# Patient Record
Sex: Male | Born: 1965 | Race: White | Hispanic: No | Marital: Single
Health system: Midwestern US, Academic
[De-identification: ages and names within clinical notes are randomized; demographics above are authoritative.]

---

## 2017-03-16 MED ORDER — DIAZEPAM 5 MG PO TAB
ORAL_TABLET | Freq: Two times a day (BID) | ORAL | 5 refills | 7.00000 days | Status: DC
Start: 2017-03-16 — End: 2017-09-13

## 2017-05-15 ENCOUNTER — Ambulatory Visit: Admit: 2017-05-15 | Discharge: 2017-05-16 | Payer: MEDICARE

## 2017-05-15 ENCOUNTER — Encounter: Admit: 2017-05-15 | Discharge: 2017-05-15 | Payer: MEDICARE

## 2017-05-15 DIAGNOSIS — Z960 Presence of urogenital implants: ICD-10-CM

## 2017-05-15 DIAGNOSIS — Z978 Presence of other specified devices: ICD-10-CM

## 2017-05-15 DIAGNOSIS — G8250 Quadriplegia, unspecified: ICD-10-CM

## 2017-05-15 DIAGNOSIS — Z720 Tobacco use: ICD-10-CM

## 2017-05-15 DIAGNOSIS — N319 Neuromuscular dysfunction of bladder, unspecified: ICD-10-CM

## 2017-05-15 DIAGNOSIS — M792 Neuralgia and neuritis, unspecified: ICD-10-CM

## 2017-05-15 DIAGNOSIS — L8990 Pressure ulcer of unspecified site, unspecified stage: ICD-10-CM

## 2017-05-15 DIAGNOSIS — R569 Unspecified convulsions: ICD-10-CM

## 2017-05-15 DIAGNOSIS — N390 Urinary tract infection, site not specified: ICD-10-CM

## 2017-05-15 DIAGNOSIS — Z9359 Other cystostomy status: ICD-10-CM

## 2017-05-15 DIAGNOSIS — S14105A Unspecified injury at C5 level of cervical spinal cord, initial encounter: ICD-10-CM

## 2017-05-16 DIAGNOSIS — L89320 Pressure ulcer of left buttock, unstageable: Secondary | ICD-10-CM

## 2017-05-16 DIAGNOSIS — R569 Unspecified convulsions: Principal | ICD-10-CM

## 2017-05-16 DIAGNOSIS — G8250 Quadriplegia, unspecified: ICD-10-CM

## 2017-08-06 ENCOUNTER — Encounter: Admit: 2017-08-06 | Discharge: 2017-08-06 | Payer: MEDICARE

## 2017-08-07 MED ORDER — LEVETIRACETAM 500 MG PO TAB
500 mg | ORAL_TABLET | Freq: Two times a day (BID) | ORAL | 5 refills | 90.00000 days | Status: AC
Start: 2017-08-07 — End: 2018-02-04

## 2017-09-13 ENCOUNTER — Encounter: Admit: 2017-09-13 | Discharge: 2017-09-13 | Payer: MEDICARE

## 2017-09-13 MED ORDER — DIAZEPAM 5 MG PO TAB
ORAL_TABLET | Freq: Two times a day (BID) | ORAL | 5 refills | 7.00000 days | Status: AC
Start: 2017-09-13 — End: 2018-03-12

## 2017-09-13 MED ORDER — DIAZEPAM 5 MG PO TAB
ORAL_TABLET | Freq: Two times a day (BID) | 5 refills | Status: CN
Start: 2017-09-13 — End: ?

## 2017-10-01 ENCOUNTER — Encounter: Admit: 2017-10-01 | Discharge: 2017-10-01 | Payer: MEDICARE

## 2017-10-01 MED ORDER — BACLOFEN 20 MG PO TAB
ORAL_TABLET | Freq: Three times a day (TID) | 5 refills | Status: AC
Start: 2017-10-01 — End: 2018-04-09

## 2017-10-01 MED ORDER — NIFEDIPINE 10 MG PO CAP
ORAL_CAPSULE | 0 refills | Status: SS | PRN
Start: 2017-10-01 — End: 2019-04-25

## 2017-10-01 MED ORDER — NIFEDIPINE 10 MG PO CAP
10 mg | Freq: Three times a day (TID) | ORAL | 0 refills | Status: CN
Start: 2017-10-01 — End: ?

## 2017-10-01 NOTE — Telephone Encounter
Baclofen 20mg = 40mg  twice a day is now increased to Baclofen 20mg  take two by mouth three times a day=40mg  Three times a day.  Dr.Hairston will call Eric Cabrera about medication procardia he is interested in and what she may recommend for him, because he has concerns about autonomic dysreflexia, but at this time his blood pressure is good.

## 2017-10-01 NOTE — Telephone Encounter
Dr.Hairston, Mr Bednarczyk called stated he will be going in to the hospital to have surgery this Wednesday because of a new decubitus ulcer, but what he is wanting is an increase of the Baclofen medication from 40mg  twice a day to 60mg  twice a day stating with not moving around sitting up in the chair because of the ulcers he is having increase in the spasms, then he mention when he lived in [place] when his spasm increased the physician would put him on Procardia to help with blood pressure if it increased to keep from having a stroke. I stated he may need to speak with his PCP about the Procardia. And that  I would call you because he is having surgery wed and I did not know if you want to increase the Baclofen right now?

## 2017-10-02 ENCOUNTER — Encounter: Admit: 2017-10-02 | Discharge: 2017-10-02 | Payer: MEDICARE

## 2017-10-02 NOTE — Telephone Encounter
Clarified the dosing of the medication Procardia 10mg , it is daily -prn spoke with [facility] pharmacist.

## 2017-12-20 ENCOUNTER — Encounter: Admit: 2017-12-20 | Discharge: 2017-12-20 | Payer: MEDICARE

## 2018-02-02 ENCOUNTER — Encounter: Admit: 2018-02-02 | Discharge: 2018-02-02 | Payer: MEDICARE

## 2018-02-04 MED ORDER — LEVETIRACETAM 500 MG PO TAB
500 mg | ORAL_TABLET | Freq: Two times a day (BID) | ORAL | 2 refills | 90.00000 days | Status: AC
Start: 2018-02-04 — End: 2018-09-16

## 2018-03-12 ENCOUNTER — Encounter: Admit: 2018-03-12 | Discharge: 2018-03-12 | Payer: MEDICARE

## 2018-03-12 MED ORDER — DIAZEPAM 5 MG PO TAB
ORAL_TABLET | Freq: Two times a day (BID) | ORAL | 5 refills | 7.00000 days | Status: AC
Start: 2018-03-12 — End: 2018-05-28

## 2018-04-08 ENCOUNTER — Encounter: Admit: 2018-04-08 | Discharge: 2018-04-08 | Payer: MEDICARE

## 2018-04-09 MED ORDER — BACLOFEN 20 MG PO TAB
ORAL_TABLET | Freq: Three times a day (TID) | 1 refills | Status: AC
Start: 2018-04-09 — End: 2018-05-28

## 2018-05-28 ENCOUNTER — Ambulatory Visit: Admit: 2018-05-28 | Discharge: 2018-05-29 | Payer: MEDICARE

## 2018-05-28 ENCOUNTER — Encounter: Admit: 2018-05-28 | Discharge: 2018-05-28 | Payer: MEDICARE

## 2018-05-28 DIAGNOSIS — Z9359 Other cystostomy status: ICD-10-CM

## 2018-05-28 DIAGNOSIS — G8250 Quadriplegia, unspecified: Principal | ICD-10-CM

## 2018-05-28 DIAGNOSIS — S14105D Unspecified injury at C5 level of cervical spinal cord, subsequent encounter: Principal | ICD-10-CM

## 2018-05-28 DIAGNOSIS — S14105A Unspecified injury at C5 level of cervical spinal cord, initial encounter: ICD-10-CM

## 2018-05-28 DIAGNOSIS — L8990 Pressure ulcer of unspecified site, unspecified stage: ICD-10-CM

## 2018-05-28 DIAGNOSIS — M792 Neuralgia and neuritis, unspecified: ICD-10-CM

## 2018-05-28 DIAGNOSIS — Z978 Presence of other specified devices: ICD-10-CM

## 2018-05-28 DIAGNOSIS — Z960 Presence of urogenital implants: ICD-10-CM

## 2018-05-28 DIAGNOSIS — Z720 Tobacco use: ICD-10-CM

## 2018-05-28 DIAGNOSIS — N319 Neuromuscular dysfunction of bladder, unspecified: ICD-10-CM

## 2018-05-28 DIAGNOSIS — N390 Urinary tract infection, site not specified: ICD-10-CM

## 2018-05-28 DIAGNOSIS — R569 Unspecified convulsions: ICD-10-CM

## 2018-05-28 MED ORDER — DIAZEPAM 5 MG PO TAB
ORAL_TABLET | Freq: Two times a day (BID) | ORAL | 5 refills | 7.00000 days | Status: AC
Start: 2018-05-28 — End: 2018-10-15

## 2018-05-28 MED ORDER — BACLOFEN 20 MG PO TAB
ORAL_TABLET | Freq: Three times a day (TID) | 1 refills | Status: AC
Start: 2018-05-28 — End: 2018-10-21

## 2018-05-29 DIAGNOSIS — R569 Unspecified convulsions: ICD-10-CM

## 2018-05-29 DIAGNOSIS — Z720 Tobacco use: ICD-10-CM

## 2018-05-29 DIAGNOSIS — G904 Autonomic dysreflexia: ICD-10-CM

## 2018-05-29 DIAGNOSIS — N319 Neuromuscular dysfunction of bladder, unspecified: ICD-10-CM

## 2018-05-29 DIAGNOSIS — G8250 Quadriplegia, unspecified: Secondary | ICD-10-CM

## 2018-05-29 DIAGNOSIS — L89150 Pressure ulcer of sacral region, unstageable: Secondary | ICD-10-CM

## 2018-09-15 ENCOUNTER — Encounter: Admit: 2018-09-15 | Discharge: 2018-09-15 | Payer: MEDICARE

## 2018-09-16 ENCOUNTER — Encounter: Admit: 2018-09-16 | Discharge: 2018-09-16 | Payer: MEDICARE

## 2018-09-16 MED ORDER — DIAZEPAM 5 MG PO TAB
ORAL_TABLET | Freq: Two times a day (BID) | 5 refills
Start: 2018-09-16 — End: ?

## 2018-09-16 MED ORDER — LEVETIRACETAM 500 MG PO TAB
500 mg | ORAL_TABLET | Freq: Two times a day (BID) | ORAL | 2 refills | 90.00000 days | Status: AC
Start: 2018-09-16 — End: 2018-10-21

## 2018-09-23 ENCOUNTER — Encounter: Admit: 2018-09-23 | Discharge: 2018-09-23 | Payer: MEDICARE

## 2018-10-02 ENCOUNTER — Encounter: Admit: 2018-10-02 | Discharge: 2018-10-02 | Payer: MEDICARE

## 2018-10-15 MED ORDER — DIAZEPAM 5 MG PO TAB
ORAL_TABLET | Freq: Two times a day (BID) | 5 refills | Status: SS
Start: 2018-10-15 — End: 2019-04-01

## 2018-10-18 ENCOUNTER — Encounter: Admit: 2018-10-18 | Discharge: 2018-10-18 | Payer: MEDICARE

## 2018-10-21 ENCOUNTER — Encounter: Admit: 2018-10-21 | Discharge: 2018-10-21 | Payer: MEDICARE

## 2018-10-21 MED ORDER — LEVETIRACETAM 500 MG PO TAB
500 mg | ORAL_TABLET | Freq: Two times a day (BID) | ORAL | 2 refills | Status: SS
Start: 2018-10-21 — End: 2019-04-01

## 2018-10-21 MED ORDER — BACLOFEN 20 MG PO TAB
ORAL_TABLET | Freq: Three times a day (TID) | 1 refills | Status: SS
Start: 2018-10-21 — End: 2019-04-01

## 2018-10-25 ENCOUNTER — Encounter: Admit: 2018-10-25 | Discharge: 2018-10-25 | Payer: MEDICARE

## 2019-01-24 ENCOUNTER — Encounter: Admit: 2019-01-24 | Discharge: 2019-01-24 | Payer: MEDICARE

## 2019-03-22 ENCOUNTER — Encounter: Admit: 2019-03-22 | Discharge: 2019-03-22 | Payer: MEDICARE

## 2019-03-22 ENCOUNTER — Encounter: Admit: 2019-03-22 | Discharge: 2019-03-23 | Payer: MEDICARE

## 2019-03-23 ENCOUNTER — Encounter: Admit: 2019-03-23 | Discharge: 2019-03-23 | Payer: MEDICARE

## 2019-03-23 ENCOUNTER — Inpatient Hospital Stay
Admit: 2019-03-23 | Discharge: 2019-04-02 | Disposition: A | Discharge status: Skilled Nursing Facility | Payer: MEDICARE | Source: Other Acute Inpatient Hospital

## 2019-03-23 DIAGNOSIS — G4089 Other seizures: Secondary | ICD-10-CM

## 2019-03-23 DIAGNOSIS — L89320 Pressure ulcer of left buttock, unstageable: ICD-10-CM

## 2019-03-23 LAB — COMPREHENSIVE METABOLIC PANEL
Lab: 11 10*3/uL (ref 3–12)
Lab: 135 MMOL/L — ABNORMAL LOW (ref 137–147)
Lab: 14 U/L (ref 7–40)
Lab: 4.3 MMOL/L (ref 3.5–5.1)
Lab: 7 U/L (ref 7–56)
Lab: 8.7 mg/dL (ref 8.5–10.6)
Lab: >60 mL/min (ref >60)

## 2019-03-23 LAB — URINALYSIS MICROSCOPIC REFLEX TO CULTURE

## 2019-03-23 LAB — URINALYSIS DIPSTICK REFLEX TO CULTURE
Lab: NEGATIVE
Lab: POSITIVE — AB

## 2019-03-23 LAB — C REACTIVE PROTEIN (CRP): Lab: 20 mg/dL — ABNORMAL HIGH (ref <1.0)

## 2019-03-23 LAB — PROTIME INR (PT): Lab: 1.4 g/dL — ABNORMAL HIGH (ref 0.8–1.2)

## 2019-03-23 LAB — CBC AND DIFF
Lab: 13 10*3/uL — ABNORMAL HIGH (ref 4.5–11.0)
Lab: 5.1 M/UL (ref 4.4–5.5)

## 2019-03-23 LAB — MAGNESIUM: Lab: 2 mg/dL (ref 1.6–2.6)

## 2019-03-23 MED ORDER — CLINDAMYCIN IN 5 % DEXTROSE 900 MG/50 ML IV PGBK
900 mg | INTRAVENOUS | 0 refills | Status: DC
Start: 2019-03-23 — End: 2019-03-24
  Administered 2019-03-23 – 2019-03-24 (×5): 900 mg via INTRAVENOUS

## 2019-03-23 MED ORDER — DIAZEPAM 5 MG PO TAB
5 mg | Freq: Two times a day (BID) | ORAL | 0 refills | Status: DC
Start: 2019-03-23 — End: 2019-03-24
  Administered 2019-03-23 – 2019-03-24 (×3): 5 mg via ORAL

## 2019-03-23 MED ORDER — ENOXAPARIN 40 MG/0.4 ML SC SYRG
40 mg | Freq: Every day | SUBCUTANEOUS | 0 refills | Status: DC
Start: 2019-03-23 — End: 2019-03-25
  Administered 2019-03-23: 09:00:00 40 mg via SUBCUTANEOUS

## 2019-03-23 MED ORDER — BACLOFEN 20 MG PO TAB
40 mg | Freq: Three times a day (TID) | ORAL | 0 refills | Status: DC
Start: 2019-03-23 — End: 2019-03-31
  Administered 2019-03-23 – 2019-03-31 (×24): 40 mg via ORAL

## 2019-03-23 MED ORDER — IMS MIXTURE TEMPLATE
750 mg | Freq: Two times a day (BID) | ORAL | 0 refills | Status: DC
Start: 2019-03-23 — End: 2019-04-02
  Administered 2019-03-24 – 2019-04-01 (×34): 750 mg via ORAL

## 2019-03-23 MED ORDER — LEVETIRACETAM 500 MG PO TAB
500 mg | Freq: Two times a day (BID) | ORAL | 0 refills | Status: DC
Start: 2019-03-23 — End: 2019-03-23
  Administered 2019-03-23: 16:00:00 500 mg via ORAL

## 2019-03-23 MED ORDER — SENNOSIDES-DOCUSATE SODIUM 8.6-50 MG PO TAB
2 | Freq: Every day | ORAL | 0 refills | Status: DC
Start: 2019-03-23 — End: 2019-03-27
  Administered 2019-03-24 – 2019-03-25 (×2): 2 via ORAL

## 2019-03-23 MED ORDER — DOCUSATE SODIUM 100 MG PO CAP
100 mg | Freq: Every day | ORAL | 0 refills | Status: DC
Start: 2019-03-23 — End: 2019-03-23
  Administered 2019-03-23: 14:00:00 100 mg via ORAL

## 2019-03-23 MED ORDER — LACTATED RINGERS IV SOLP
1000 mL | Freq: Once | INTRAVENOUS | 0 refills | Status: CP
Start: 2019-03-23 — End: ?
  Administered 2019-03-23: 16:00:00 1000 mL via INTRAVENOUS

## 2019-03-23 MED ORDER — LACTATED RINGERS IV SOLP
500 mL | INTRAVENOUS | 0 refills | Status: DC
Start: 2019-03-23 — End: 2019-03-23

## 2019-03-23 MED ORDER — GADOBENATE DIMEGLUMINE 529 MG/ML (0.1MMOL/0.2ML) IV SOLN
15 mL | Freq: Once | INTRAVENOUS | 0 refills | Status: CP
Start: 2019-03-23 — End: ?
  Administered 2019-03-23: 15:00:00 15 mL via INTRAVENOUS

## 2019-03-23 MED ADMIN — SODIUM CHLORIDE 0.9 % IV SOLP [27838]: 1000 mL | INTRAVENOUS | @ 10:00:00 | Stop: 2019-03-23 | NDC 00338004904
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
## 2019-03-24 ENCOUNTER — Encounter: Admit: 2019-03-24 | Discharge: 2019-03-24 | Payer: MEDICARE

## 2019-03-24 ENCOUNTER — Inpatient Hospital Stay: Admit: 2019-03-24 | Discharge: 2019-03-24 | Payer: MEDICARE

## 2019-03-24 DIAGNOSIS — Z960 Presence of urogenital implants: ICD-10-CM

## 2019-03-24 DIAGNOSIS — M792 Neuralgia and neuritis, unspecified: ICD-10-CM

## 2019-03-24 DIAGNOSIS — N390 Urinary tract infection, site not specified: ICD-10-CM

## 2019-03-24 DIAGNOSIS — N319 Neuromuscular dysfunction of bladder, unspecified: ICD-10-CM

## 2019-03-24 DIAGNOSIS — S14105A Unspecified injury at C5 level of cervical spinal cord, initial encounter: ICD-10-CM

## 2019-03-24 DIAGNOSIS — G8250 Quadriplegia, unspecified: Secondary | ICD-10-CM

## 2019-03-24 DIAGNOSIS — R569 Unspecified convulsions: ICD-10-CM

## 2019-03-24 DIAGNOSIS — L8990 Pressure ulcer of unspecified site, unspecified stage: ICD-10-CM

## 2019-03-24 DIAGNOSIS — Z9359 Other cystostomy status: ICD-10-CM

## 2019-03-24 DIAGNOSIS — G4089 Other seizures: Secondary | ICD-10-CM

## 2019-03-24 DIAGNOSIS — Z720 Tobacco use: ICD-10-CM

## 2019-03-24 DIAGNOSIS — Z978 Presence of other specified devices: ICD-10-CM

## 2019-03-24 LAB — GRAM STAIN

## 2019-03-24 MED ORDER — LEVOFLOXACIN 750 MG PO TAB
750 mg | ORAL | 0 refills | Status: DC
Start: 2019-03-24 — End: 2019-03-24

## 2019-03-24 MED ORDER — VANCOMYCIN 1,250 MG IVPB
15 mg/kg | Freq: Two times a day (BID) | INTRAVENOUS | 0 refills | Status: DC
Start: 2019-03-24 — End: 2019-03-24

## 2019-03-24 MED ORDER — VANCOMYCIN 1G/250ML D5W IVPB (VIAL2BAG)
1000 mg | Freq: Two times a day (BID) | INTRAVENOUS | 0 refills | Status: DC
Start: 2019-03-24 — End: 2019-03-26
  Administered 2019-03-24 – 2019-03-26 (×8): 1000 mg via INTRAVENOUS

## 2019-03-24 MED ORDER — DIAZEPAM 5 MG PO TAB
2.5 mg | Freq: Two times a day (BID) | ORAL | 0 refills | Status: DC
Start: 2019-03-24 — End: 2019-04-02
  Administered 2019-03-25 – 2019-04-02 (×17): 2.5 mg via ORAL

## 2019-03-24 MED ORDER — PIPERACILLIN/TAZOBACTAM 4.5 G/NS IVPB (MB+)
4.5 g | INTRAVENOUS | 0 refills | Status: DC
Start: 2019-03-24 — End: 2019-03-28
  Administered 2019-03-24 – 2019-03-28 (×32): 4.5 g via INTRAVENOUS

## 2019-03-24 MED ORDER — VANCOMYCIN PHARMACY TO MANAGE
1 | 0 refills | Status: DC
Start: 2019-03-24 — End: 2019-03-26

## 2019-03-24 MED ORDER — VANCOMYCIN 1,500 MG IVPB
20 mg/kg | Freq: Once | INTRAVENOUS | 0 refills | Status: DC
Start: 2019-03-24 — End: 2019-03-24

## 2019-03-24 MED ADMIN — LACTATED RINGERS IV SOLP [4318]: 1000 mL | INTRAVENOUS | @ 02:00:00 | Stop: 2019-03-24 | NDC 00338011704

## 2019-03-24 NOTE — Progress Notes
03/23/19 2053   Vitals   BP (!) 79/46   Mean NBP (Calculated) 57 MM HG     Med-2 paged of above.

## 2019-03-24 NOTE — Progress Notes
03/23/19 1716   Vitals   BP (!) 82/43   Mean NBP (Calculated) 56 MM HG     Dr. Alona Bene notified. Pt asymptomatic, no new orders at this time. Will continue to monitor.

## 2019-03-25 ENCOUNTER — Inpatient Hospital Stay: Admit: 2019-03-25 | Discharge: 2019-03-25 | Payer: MEDICARE

## 2019-03-25 DIAGNOSIS — G4089 Other seizures: ICD-10-CM

## 2019-03-25 MED ORDER — VITS A AND D-WHITE PET-LANOLIN TP OINT
TOPICAL | 0 refills | Status: DC | PRN
Start: 2019-03-25 — End: 2019-04-02
  Administered 2019-03-25: 22:00:00 via TOPICAL

## 2019-03-25 MED ORDER — BISACODYL 10 MG RE SUPP
10 mg | Freq: Once | RECTAL | 0 refills | Status: CP
Start: 2019-03-25 — End: ?
  Administered 2019-03-26: 04:00:00 10 mg via RECTAL

## 2019-03-25 MED ADMIN — SODIUM CHLORIDE 0.9 % IV SOLP [27838]: 250 mL | INTRAVENOUS | @ 03:00:00 | Stop: 2019-03-25 | NDC 00338004902

## 2019-03-25 NOTE — Progress Notes
2149  Text sent to Med 2 [phone] to request order for [facility].  Pts extremities difficult to accurately monitor.  RT to place ear probe.

## 2019-03-25 NOTE — Progress Notes
Eric Cabrera is a 53 y.o. male.  Patient reports he is concerned with getting medicare to approve a certain mattress to help with his bedsore when he goes home after this hospitalization, but reports no acute events overnight. Denies any fevers, N/V or chills or seizure-like symptoms         Medications  Scheduled Meds:baclofen (LIORESAL) tablet 40 mg, 40 mg, Oral, TID  diazePAM (VALIUM) tablet 2.5 mg, 2.5 mg, Oral, BID  levETIRAcetam (KEPPRA) tablet 750 mg, 750 mg, Oral, BID  piperacillin/tazobactam (ZOSYN) 4.5 g in sodium chloride 0.9% (NS) 100 mL IVPB (MB+), 4.5 g, Intravenous, Q6H*  senna/docusate (SENOKOT-S) tablet 2 tablet, 2 tablet, Oral, QDAY  vancomycin (VANCOCIN) 1,000 mg in dextrose 5% (D5W) 250 mL IVPB (Vial2Bag), 1,000 mg, Intravenous, Q12H*    Continuous Infusions:  PRN and Respiratory Meds:vancomycin, pharmacy to manage Per Pharmacy, vitamin A & D PRN      Objective                       Vital Signs: Last Filed                 Vital Signs: 24 Hour Range   BP: 94/60 (04/21 1316)  Temp: 36.8 ???C (98.3 ???F) (04/21 0830)  Pulse: 79 (04/21 1316)  Respirations: 18 PER MINUTE (04/21 1316)  SpO2: 97 % (04/21 1316) BP: (73-113)/(39-67)   Temp:  [35.8 ???C (96.5 ???F)-36.8 ???C (98.3 ???F)]   Pulse:  [61-105]   Respirations:  [14 PER MINUTE-18 PER MINUTE]   SpO2:  [94 %-97 %]      Vitals:    03/23/19 0200   Weight: 76.8 kg (169 lb 5 oz)           Physical Exam     53 y.o. male     General: alert, in no acute distress, quadriplegic  Eyes: No scleral icterus  Ears: Normal appearing external ears  Throat: No pharyngeal erythema identified, no lymphadenopathy  CV: HR RRR, no murmur, no rub/gallop  Pulm: Lungs CTA bilaterally, no wheezes/rhonci/rales auscultated  Abdomen: hypoactive bowel sound, soft, non-tender, distended abdomen with air  Extremities: No lower extremity edema idenitifed. Small areas of skin breakdown. Near coccyx patient has too fistulized tracts that look clean and intact. He has redness surrounding right greater trochanter with fluctuance that feels like subcutaneous edema.  Integumentary: No obvious rash or skin breakdown  Neuro: CN II-XII intact, oriented x 4  Psych: Normal mood and affect    Lab Review  CBC w/Diff    Lab Results   Component Value Date/Time    WBC 5.6 03/25/2019 04:26 AM    RBC 4.52 03/25/2019 04:26 AM    HGB 13.4 (L) 03/25/2019 04:26 AM    HCT 40.9 03/25/2019 04:26 AM    MCV 90.5 03/25/2019 04:26 AM    MCH 29.6 03/25/2019 04:26 AM    MCHC 32.7 03/25/2019 04:26 AM    RDW 15.0 03/25/2019 04:26 AM    PLTCT 342 03/25/2019 04:26 AM    MPV 7.4 03/25/2019 04:26 AM    Lab Results   Component Value Date/Time    NEUT 70 03/25/2019 04:26 AM    ANC 3.90 03/25/2019 04:26 AM    LYMA 19 (L) 03/25/2019 04:26 AM    ALC 1.00 03/25/2019 04:26 AM    MONA 7 03/25/2019 04:26 AM    AMC 0.40 03/25/2019 04:26 AM    EOSA 3 03/25/2019 04:26 AM    AEC 0.20  03/25/2019 04:26 AM    BASA 1 03/25/2019 04:26 AM    ABC 0.00 03/25/2019 04:26 AM        Comprehensive Metabolic Profile    Lab Results   Component Value Date/Time    NA 143 03/25/2019 04:26 AM    K 4.5 03/25/2019 04:26 AM    CL 105 03/25/2019 04:26 AM    CO2 30 03/25/2019 04:26 AM    GAP 8 03/25/2019 04:26 AM    BUN 6 (L) 03/25/2019 04:26 AM    CR 0.24 (L) 03/25/2019 04:26 AM    GLU 90 03/25/2019 04:26 AM    GLU 67 08/17/2016    Lab Results   Component Value Date/Time    CA 8.7 03/25/2019 04:26 AM    PO4 3.3 03/23/2019 04:16 AM    ALBUMIN 3.0 (L) 03/25/2019 04:26 AM    TOTPROT 6.9 03/25/2019 04:26 AM    ALKPHOS 50 03/25/2019 04:26 AM    AST 11 03/25/2019 04:26 AM    ALT 9 03/25/2019 04:26 AM    TOTBILI 0.2 (L) 03/25/2019 04:26 AM    GFR >60 03/25/2019 04:26 AM    GFRAA >60 03/25/2019 04:26 AM          Other pertinent lab and radiology reviewed and/or noted in Assessment and Plan.      Patient seen and discussed with Dr. Dorien Chihuahua MD  PGY1 Internal Medicine   [facility] [place]   [phone]

## 2019-03-25 NOTE — Anesthesia Pre-Procedure Evaluation
Airway Findings      Mallampati: II      TM distance: >3 FB      Neck ROM: full      Mouth opening: good      Airway patency: adequate    Dental Findings: Negative      Cardiovascular Findings: Negative      Rhythm: regular      Rate: normal    Pulmonary Findings:    Decreased breath sounds.    Abdominal Findings: Negative      Neurological Findings: Negative         Diagnostic Tests  Hematology:   Lab Results   Component Value Date    HGB 13.4 03/25/2019    HCT 40.9 03/25/2019    PLTCT 342 03/25/2019    WBC 5.6 03/25/2019    NEUT 70 03/25/2019    ANC 3.90 03/25/2019    ALC 1.00 03/25/2019    MONA 7 03/25/2019    AMC 0.40 03/25/2019    EOSA 3 03/25/2019    ABC 0.00 03/25/2019    MCV 90.5 03/25/2019    MCH 29.6 03/25/2019    MCHC 32.7 03/25/2019    MPV 7.4 03/25/2019    RDW 15.0 03/25/2019         General Chemistry:   Lab Results   Component Value Date    NA 143 03/25/2019    K 4.5 03/25/2019    CL 105 03/25/2019    CO2 30 03/25/2019    GAP 8 03/25/2019    BUN 6 03/25/2019    CR 0.24 03/25/2019    GLU 90 03/25/2019    GLU 67 08/17/2016    CA 8.7 03/25/2019    ALBUMIN 3.0 03/25/2019    LACTIC 0.8 08/14/2014    OBSCA 1.12 08/19/2014    MG 2.0 03/23/2019    TOTBILI 0.2 03/25/2019    PO4 3.3 03/23/2019      Coagulation:   Lab Results   Component Value Date    PTT 31.0 09/27/2016    INR 1.4 03/23/2019         Anesthesia Plan    ASA score: 3   Plan: MAC and general  Induction method: intravenous  NPO status: acceptable      Informed Consent  Anesthetic plan and risks discussed with patient.

## 2019-03-26 ENCOUNTER — Encounter: Admit: 2019-03-26 | Discharge: 2019-03-26 | Payer: MEDICARE

## 2019-03-26 DIAGNOSIS — Z960 Presence of urogenital implants: ICD-10-CM

## 2019-03-26 DIAGNOSIS — Z9359 Other cystostomy status: ICD-10-CM

## 2019-03-26 DIAGNOSIS — N319 Neuromuscular dysfunction of bladder, unspecified: ICD-10-CM

## 2019-03-26 DIAGNOSIS — R569 Unspecified convulsions: ICD-10-CM

## 2019-03-26 DIAGNOSIS — Z720 Tobacco use: ICD-10-CM

## 2019-03-26 DIAGNOSIS — M792 Neuralgia and neuritis, unspecified: ICD-10-CM

## 2019-03-26 DIAGNOSIS — L8990 Pressure ulcer of unspecified site, unspecified stage: ICD-10-CM

## 2019-03-26 DIAGNOSIS — G4089 Other seizures: ICD-10-CM

## 2019-03-26 DIAGNOSIS — N390 Urinary tract infection, site not specified: ICD-10-CM

## 2019-03-26 DIAGNOSIS — Z978 Presence of other specified devices: ICD-10-CM

## 2019-03-26 DIAGNOSIS — G8250 Quadriplegia, unspecified: ICD-10-CM

## 2019-03-26 DIAGNOSIS — S14105A Unspecified injury at C5 level of cervical spinal cord, initial encounter: ICD-10-CM

## 2019-03-26 LAB — CULTURE-WOUND/TISSUE/FLUID(AEROBIC ONLY)W/SENSITIVITY

## 2019-03-26 LAB — CULTURE-URINE W/SENSITIVITY
Lab: <10
Lab: >10
Lab: >10 — AB

## 2019-03-26 LAB — VANCOMYCIN TROUGH: Lab: 11 ug/mL (ref 10.0–20.0)

## 2019-03-26 LAB — PROTIME INR (PT): Lab: 1.7 — ABNORMAL HIGH (ref 0.8–1.2)

## 2019-03-26 MED ORDER — MIDAZOLAM 1 MG/ML IJ SOLN
INTRAVENOUS | 0 refills | Status: DC
Start: 2019-03-26 — End: 2019-03-26
  Administered 2019-03-26: 19:00:00 2 mg via INTRAVENOUS

## 2019-03-26 MED ORDER — DOCUSATE SODIUM 100 MG PO CAP
100 mg | Freq: Two times a day (BID) | ORAL | 0 refills | Status: DC
Start: 2019-03-26 — End: 2019-04-02
  Administered 2019-03-27 – 2019-04-02 (×13): 100 mg via ORAL

## 2019-03-26 MED ORDER — CEFAZOLIN 1 GRAM IJ SOLR
0 refills | Status: DC
Start: 2019-03-26 — End: 2019-03-26
  Administered 2019-03-26: 20:00:00 2 g via INTRAVENOUS

## 2019-03-26 MED ORDER — ZINC SULFATE 220 (50) MG PO CAP
220 mg | Freq: Every day | ORAL | 0 refills | Status: DC
Start: 2019-03-26 — End: 2019-04-02
  Administered 2019-03-27 – 2019-04-01 (×6): 220 mg via ORAL

## 2019-03-26 MED ORDER — LACTATED RINGERS IV SOLP
1000 mL | INTRAVENOUS | 0 refills | Status: DC
Start: 2019-03-26 — End: 2019-03-27

## 2019-03-26 MED ORDER — ASCORBIC ACID (VITAMIN C) 500 MG PO TAB
500 mg | Freq: Every day | ORAL | 0 refills | Status: DC
Start: 2019-03-26 — End: 2019-04-02
  Administered 2019-03-27 – 2019-04-01 (×6): 500 mg via ORAL

## 2019-03-26 MED ORDER — PROPOFOL 10 MG/ML IV EMUL 50 ML (INFUSION)(AM)(OR)
INTRAVENOUS | 0 refills | Status: DC
Start: 2019-03-26 — End: 2019-03-26
  Administered 2019-03-26: 20:00:00 100 ug/kg/min via INTRAVENOUS

## 2019-03-26 MED ORDER — SENNOSIDES 8.6 MG PO TAB
1 | Freq: Two times a day (BID) | ORAL | 0 refills | Status: DC
Start: 2019-03-26 — End: 2019-04-02
  Administered 2019-03-29 (×2): 1 via ORAL

## 2019-03-26 MED ADMIN — LACTATED RINGERS IV SOLP [4318]: 1000 mL | INTRAVENOUS | @ 18:00:00 | Stop: 2019-03-26 | NDC 00338011704

## 2019-03-26 NOTE — Progress Notes
Med 2 Progress Note    Name:  Eric Cabrera   Today's Date:  03/26/2019  Admission Date: 03/23/2019  LOS: 3 days                     Principal Problem:    Subacute osteomyelitis of right femur (HCC)  Active Problems:    Quadriplegic spinal paralysis (HCC)    Complicated UTI (urinary tract infection)    Neurogenic bladder    Suprapubic catheter (HCC)    Leukocytosis    Decubitus ulcer of sacral region, unstageable (HCC)    Nonintractable epilepsy (HCC)         Assessment and Plan:  Eric Cabrera is a pleasant 53 year old male with history of quadriplegia, Epilepsy,???Multiple???Pressure Ulcers,???neurogenic bladder (w/ suprapubic cath) and tobacco use who presents as a transfer from [facility] for evaluation of witnessed 'seizure-like activity' by a caregiver.  ???  Seizure:  - Patient with breakthrough seizure this afternoon 4/18. Description of recent seizure was in-line with description of seizure in Neurology note.  - Patient diagnosed with seizure initially in 2015.  - Follows with  neurology, Dr. Jenelle Mages. Last seen 05/2018.  - CT head @ OSH without evidence of bleed or mass effect.  - Keppra 500mg  BID for seizure. S/p 1500 mg Keppra load at the OSH.  - Uncertain cause currently of suspected seizure. He has been evaluated before for breakthrough seizure and decided not to pursue further MRI/EEG. Lowered threshold may be related to patient infection.  - EEG 2015 IMPRESSION: This 30 minutes long EEG recording of this 53 year old confused man is abnormal due to diffuse background slowing suggesting a mild-to-moderate bihemispheric dysfunction of nonspecific etiologies. ???The triphasic waves noted are also seen in patients with nonspecific bihemispheric dysfunction. ???The rarely seen diffusely distributed, but centrally predominant sharp wave discharges probably reflect the poorly formed vertex sharp waves, however, epileptiform discharge could not be ruled out.  ???Plan less fine overnight; reiterated desire again for get proper mattress to sleep on that will be conducive to his quadriplegia. Denies any fever or chills; is hungry but agreeable to wait until after I&D by ortho later today         Medications  Scheduled Meds:[MAR Hold] baclofen (LIORESAL) tablet 40 mg, 40 mg, Oral, TID  [MAR Hold] diazePAM (VALIUM) tablet 2.5 mg, 2.5 mg, Oral, BID  levETIRAcetam (KEPPRA) tablet 750 mg, 750 mg, Oral, BID  piperacillin/tazobactam (ZOSYN) 4.5 g in sodium chloride 0.9% (NS) 100 mL IVPB (MB+), 4.5 g, Intravenous, Q6H*  [MAR Hold] senna/docusate (SENOKOT-S) tablet 2 tablet, 2 tablet, Oral, QDAY    Continuous Infusions:  PRN and Respiratory Meds:[MAR Hold] vitamin A & D PRN      Objective                       Vital Signs: Last Filed                 Vital Signs: 24 Hour Range   BP: 101/85 (04/22 1159)  Temp: 36.3 ???C (97.4 ???F) (04/22 1159)  Pulse: 58 (04/22 1159)  Respirations: 16 PER MINUTE (04/22 1159)  SpO2: 97 % (04/22 1159) BP: (93-154)/(60-85)   Temp:  [36.3 ???C (97.4 ???F)-36.6 ???C (97.9 ???F)]   Pulse:  [51-79]   Respirations:  [14 PER MINUTE-18 PER MINUTE]   SpO2:  [94 %-100 %]      Vitals:    03/23/19 0200   Weight: 76.8 kg (169 lb  5 oz)           Physical Exam     53 y.o. male     General: alert, in no acute distress, quadriplegic  Eyes: No scleral icterus  Ears: Normal appearing external ears  Throat: No pharyngeal erythema identified, no lymphadenopathy  CV: HR RRR, no murmur, no rub/gallop  Pulm: Lungs CTA bilaterally, no wheezes/rhonci/rales auscultated  Abdomen: hypoactive bowel sound, soft, non-tender, distended abdomen with air  Extremities: No lower extremity edema idenitifed. Small areas of skin breakdown. Near coccyx patient has too fistulized tracts that look clean and intact. He has redness surrounding right greater trochanter with fluctuance that feels like subcutaneous edema.  Integumentary: No obvious rash or skin breakdown  Neuro: CN II-XII intact, oriented x 4 Psych: Normal mood and affect    Lab Review  CBC w/Diff    Lab Results   Component Value Date/Time    WBC 6.4 03/26/2019 04:26 AM    RBC 4.41 03/26/2019 04:26 AM    HGB 12.9 (L) 03/26/2019 04:26 AM    HCT 39.6 (L) 03/26/2019 04:26 AM    MCV 89.9 03/26/2019 04:26 AM    MCH 29.4 03/26/2019 04:26 AM    MCHC 32.7 03/26/2019 04:26 AM    RDW 15.1 (H) 03/26/2019 04:26 AM    PLTCT 356 03/26/2019 04:26 AM    MPV 7.1 03/26/2019 04:26 AM    Lab Results   Component Value Date/Time    NEUT 63 03/26/2019 04:26 AM    ANC 4.10 03/26/2019 04:26 AM    LYMA 27 03/26/2019 04:26 AM    ALC 1.80 03/26/2019 04:26 AM    MONA 7 03/26/2019 04:26 AM    AMC 0.40 03/26/2019 04:26 AM    EOSA 2 03/26/2019 04:26 AM    AEC 0.10 03/26/2019 04:26 AM    BASA 1 03/26/2019 04:26 AM    ABC 0.00 03/26/2019 04:26 AM        Comprehensive Metabolic Profile    Lab Results   Component Value Date/Time    NA 143 03/26/2019 04:26 AM    K 4.3 03/26/2019 04:26 AM    CL 104 03/26/2019 04:26 AM    CO2 31 (H) 03/26/2019 04:26 AM    GAP 8 03/26/2019 04:26 AM    BUN 6 (L) 03/26/2019 04:26 AM    CR 0.23 (L) 03/26/2019 04:26 AM    GLU 77 03/26/2019 04:26 AM    GLU 67 08/17/2016    Lab Results   Component Value Date/Time    CA 8.6 03/26/2019 04:26 AM    PO4 3.3 03/23/2019 04:16 AM    ALBUMIN 2.9 (L) 03/26/2019 04:26 AM    TOTPROT 6.5 03/26/2019 04:26 AM    ALKPHOS 49 03/26/2019 04:26 AM    AST 20 03/26/2019 04:26 AM    ALT 9 03/26/2019 04:26 AM    TOTBILI 0.2 (L) 03/26/2019 04:26 AM    GFR >60 03/26/2019 04:26 AM    GFRAA >60 03/26/2019 04:26 AM          Other pertinent lab and radiology reviewed and/or noted in Assessment and Plan.      Patient seen and discussed with Dr. Dorien Chihuahua MD  PGY1 Internal Medicine   [facility] [place]   [phone]

## 2019-03-27 NOTE — Progress Notes
Monocytes 6 4 - 12 %    Eosinophils 2 0 - 5 %    Basophils 1 0 - 2 %    Absolute Neutrophil Count 4.60 1.8 - 7.0 K/UL    Absolute Lymph Count 1.70 1.0 - 4.8 K/UL    Absolute Monocyte Count 0.40 0 - 0.80 K/UL    Absolute Eosinophil Count 0.10 0 - 0.45 K/UL    Absolute Basophil Count 0.00 0 - 0.20 K/UL         Assessment Eric Cabrera is a 53 y.o. male Quadriplegic with right lateral hip abscess s/p I&D 4/22    Plan    Plastics consult ordered 4/20, pending: Defer all continued local wound care to the plastics team.   Antibiotics - per ID  Diet- per primary   PT/OT - consulted  Dressings/Drains-R hip dressing currently has dry gauze hypafix in place change prn at this time (ordered).   Dispo - per primary       Mercades Bajaj A Cosima Prentiss, APRN-NP  Nurse Practitioner, Orthopedic Surgery  Pager [phone]/[place]

## 2019-03-27 NOTE — Progress Notes
Recommendation: Home with consistent supervision/assistance  Patient Currently Requires Physical Assist With: All mobility;All personal care ADLs;All home functioning ADLs    Therapist: Simonne Come, OT  Date: 03/27/2019

## 2019-03-27 NOTE — Case Management (ED)
Case Management Progress Note    NAME:Geza Turhan Chill                          MRN: [id number]              DOB:[birth year]          AGE: 52 y.o.  ADMISSION DATE: 03/23/2019             DAYS ADMITTED: LOS: 4 days      Today???s Date: 03/27/2019    Plan  Disharge to home when medically stable, anticipate next week.    Interventions  ? Support      ? Info or Referral      ? Discharge Planning   Discharge Planning: Durable Medical Equipment and Supplies   ??? Patient would like a higher acuity mattress for his hopstial bed upon discharge.  ??? Currently has a low airloss mattress.  ??? Spoke with Lurena Joiner at [facility] in [place] [phone]).  ??? Lurena Joiner states that he received his low airloss mattress in August 2018.  ??? Informed Lurena Joiner that he would like a [employer] mattress.  ??? Lurena Joiner will be in touch with her superiors to inquire if he is eligible and will get back with me.  ??? Will CTM and provide care as able.  Update 1530  ??? Received a phone call from [place] at [facility].  ??? The highest level of mattress that they have available is the low air loss mattress that the patient currently has.  ??? Other DME company may be able to provide a different home mattress to patient.  ??? Will work with multidisciplinary team to determine type of mattress recommended at home.  ??? Will work with patient to determine if he wishes to use a different DME company.  ? Medication Needs         ? Financial      ? Legal      ? Other        Disposition  ? Expected Discharge Date    Expected Discharge Date: 04/01/19  Expected Discharge Time: 1600  ? Transportation   Does the patient need discharge transport arranged?: No(patient has a Therapist, nutritional Name, Phone and Availability #1: brother  Transportation Name, Phone and Availability #2: caretaker  Does the patient use Medicaid Transportation?: No  ? Next Level of Care (Acute Psych discharges only)      ? Discharge Disposition      Durable Medical Equipment No service has been selected for the patient.      [place] [facility]      No service has been selected for the patient.      [facility]      No service has been selected for the patient.      [facility]      No service has been selected for the patient.        Grayland Ormond, RN  Integrated Nursing Case Manager  Phone: [phone]  Pager: [phone]

## 2019-03-28 ENCOUNTER — Encounter: Admit: 2019-03-28 | Discharge: 2019-03-28 | Payer: MEDICARE

## 2019-03-28 DIAGNOSIS — N319 Neuromuscular dysfunction of bladder, unspecified: ICD-10-CM

## 2019-03-28 DIAGNOSIS — Z960 Presence of urogenital implants: ICD-10-CM

## 2019-03-28 DIAGNOSIS — R569 Unspecified convulsions: ICD-10-CM

## 2019-03-28 DIAGNOSIS — N390 Urinary tract infection, site not specified: ICD-10-CM

## 2019-03-28 DIAGNOSIS — Z978 Presence of other specified devices: ICD-10-CM

## 2019-03-28 DIAGNOSIS — G8250 Quadriplegia, unspecified: Principal | ICD-10-CM

## 2019-03-28 DIAGNOSIS — Z9359 Other cystostomy status: ICD-10-CM

## 2019-03-28 DIAGNOSIS — S14105A Unspecified injury at C5 level of cervical spinal cord, initial encounter: ICD-10-CM

## 2019-03-28 DIAGNOSIS — L8990 Pressure ulcer of unspecified site, unspecified stage: ICD-10-CM

## 2019-03-28 DIAGNOSIS — Z720 Tobacco use: ICD-10-CM

## 2019-03-28 DIAGNOSIS — M792 Neuralgia and neuritis, unspecified: ICD-10-CM

## 2019-03-28 MED ORDER — BISACODYL 10 MG RE SUPP
10 mg | Freq: Once | RECTAL | 0 refills | Status: CP
Start: 2019-03-28 — End: ?
  Administered 2019-03-29: 02:00:00 10 mg via RECTAL

## 2019-03-28 MED ORDER — ERTAPENEM 1GM IVP
1 g | INTRAVENOUS | 0 refills | Status: DC
Start: 2019-03-28 — End: 2019-03-31
  Administered 2019-03-28 – 2019-03-30 (×3): 1 g via INTRAVENOUS

## 2019-03-28 MED ORDER — ERTAPENEM 1GM IVP
1 g | INTRAVENOUS | 0 refills | Status: CN
Start: 2019-03-28 — End: ?

## 2019-03-28 MED ORDER — ENOXAPARIN 40 MG/0.4 ML SC SYRG
40 mg | Freq: Every day | SUBCUTANEOUS | 0 refills | Status: DC
Start: 2019-03-28 — End: 2019-04-02
  Administered 2019-03-29 – 2019-04-02 (×5): 40 mg via SUBCUTANEOUS

## 2019-03-28 MED ADMIN — WATER FOR INJECTION, STERILE IJ SOLN [79513]: 10 mL | INTRAVENOUS | @ 21:00:00 | Stop: 2019-03-28 | NDC 00409488723

## 2019-03-28 NOTE — Consults
Interventional Radiology Consult Note with Pre-procedural History and Physical      Admission Date: 03/23/2019  LOS: 5 days                       Principal Problem:    Subacute osteomyelitis of right femur (HCC)  Active Problems:    Quadriplegic spinal paralysis (HCC)    Complicated UTI (urinary tract infection)    Neurogenic bladder    Suprapubic catheter (HCC)    Leukocytosis    Decubitus ulcer of sacral region, unstageable (HCC)    Nonintractable epilepsy (HCC)      Reason for consult: CVC for access route for osteomyelitis .     Assessment:   - Admitted with OM; Hx quadriplegia and epilepsy  - Pt is alert, contracted in LUE, reports he has had a PICC in RUE in past. Agreeable to tunneled non cuffed line if IR is unable to place PICC.   - Labs, medications, and allergies meet procedural protocol.     -   Platelet Count   Date Value Ref Range Status   03/28/2019 351 150 - 400 K/UL Final   ;   INR   Date Value Ref Range Status   03/26/2019 1.7 (H) 0.8 - 1.2 Final       Plan:  - Will proceed with PICC with local anesthetic, if unable to place will plan on tunneled non cuffed line with sedation  - Pt last took clears around 10am so could potentially have sedation if needed. Not needed for PICC.   __________________________________________________________________    Procedure: PICC line; if unable to obtain will plan for tunneled non cuffed CVC placement     IR Pre Procedure Notes: Pt unable to physically sign but gave verbal consent and paper in chart.   __________________________________________________________________    Chief Complaint:  Osteomyelitis     Previous Anesthetic/Sedation History:  Reviewed.    History of present illness:  Eric Cabrera is a 53 y.o. male patient with hx of quadriplegia and epilepsy. IR consulted for line placement. See ROS below for current symptoms    Review of Systems  Constitutional: negative  Respiratory: negative  Gastrointestinal: negative    Medications We appreciate being able to participate in this patient's care. Please page with any questions or concerns.    Tenzin Pavon P Divine-Thiele, APRN-NP   Pgr [phone]    IR Team Pager [phone] (After-hours and Weekends)

## 2019-03-29 LAB — CULTURE-BLOOD W/SENSITIVITY

## 2019-03-29 MED ADMIN — WATER FOR INJECTION, STERILE IJ SOLN [79513]: 20 mL | INTRAVENOUS | @ 21:00:00 | Stop: 2019-03-29 | NDC 00409488723

## 2019-03-29 NOTE — Case Management (ED)
[facility], and [facility] and they do not provide as well.  ??? Medicare guidelines for this bed are as follows:  ??? MD to reevaluate and re-certify on a monthly basis  ??? stage 3 or stage 4 pressure wound  ??? Bedridden, severe limited mobility  ??? If patient did not have this bed they would be institutionalized  ??? 1 month of wound treatment on low air loss mattress  ??? Caregiver providing repositioning every 2 hours  ??? Necessary treatment and care of wounds: nutrition, dressings, cleaning, management of wounds  ??? Weekly assessment by nurse, MD, or licensed healthcare practitioner.  ??? Informed Mr. Mathieson of above requirement and he will not agree to turning Q2hrs.  ??? Will CTM and provide assistance as able.  ? Medication Needs           ? Financial      ? Legal      ? Other        Disposition  ? Expected Discharge Date    Expected Discharge Date: 04/02/19  Expected Discharge Time: 1600  ? Transportation   Does the patient need discharge transport arranged?: No(patient has a Therapist, nutritional Name, Phone and Availability #1: brother  Transportation Name, Phone and Availability #2: caretaker  Does the patient use Medicaid Transportation?: No  ? Next Level of Care (Acute Psych discharges only)      ? Discharge Disposition      Durable Medical Equipment      No service has been selected for the patient.      [place] [facility]      No service has been selected for the patient.      [facility]      No service has been selected for the patient.      [facility]      No service has been selected for the patient.        Grayland Ormond, RN  Integrated Nursing Case Manager  Phone: [phone]  Pager: [phone]

## 2019-03-29 NOTE — Progress Notes
Med 2 Progress Note    Name:  Eric Cabrera   Today's Date:  03/29/2019  Admission Date: 03/23/2019  LOS: 6 days                     Principal Problem:    Subacute osteomyelitis of right femur (HCC)  Active Problems:    Quadriplegic spinal paralysis (HCC)    Complicated UTI (urinary tract infection)    Neurogenic bladder    Suprapubic catheter (HCC)    Leukocytosis    Decubitus ulcer of sacral region, unstageable (HCC)    Nonintractable epilepsy (HCC)         Assessment and Plan:  Eric Cabrera is a pleasant 54 year old male with history of quadriplegia, Epilepsy,???Multiple???Pressure Ulcers,???neurogenic bladder (w/ suprapubic cath) and tobacco use who presents as a transfer from [facility] for evaluation of witnessed 'seizure-like activity' by a caregiver.  ???  Wound of right hip:  Leukocytosis:  - PE showing mild redness over the right greater trochanter of his right hip. NO TTP however patient without sensation overlying this area.. Currently, there is no drainable collection that I could identify. Uncertain if current redness represents cellulitis, however given high risk and history of osteo of hip, will pursue infectious workup.  - History of osteomyelitis over stage IV right ischial decub ulcer 2015, however PE shows clean/dry tracts over these fistulas without any surrounding cellulitis. Recurrence in 2018 which required surgery by Dr. Sharlet Salina stone  - WBC @ OSH of 14.5, neutrophil percent of 77%.  - No obvious clinical signs of infection other than cellulitis. UA @ OSH with 1+ LE, 3+ blood, positive nitrate, 11-25 WBC, 3+ bacteria.  - DDx: celllulitis vs osteo vs pressure related redness  -???Ortho ???took???to OR for I&D 4/22???for R hip  - IR placed PICC 4/24  Plan  > Consulted???wound care; appreciate recs   > Blood cultures  > Plastic surgery consulted for repair of ischial wound; will need to be nutritionally optimized; recommend patient seeing them outpatient in Gen: Alert, oriented, NAD  Eyes; pupils equal, no scleral icterus  Cv: s1/ s2, no murmur  Pulm: CTAB  Abd: soft, NT, ND, + BS  Ext: no edema, rashes noted    Lab Review  CBC w/Diff    Lab Results   Component Value Date/Time    WBC 5.6 03/29/2019 04:21 AM    RBC 4.39 (L) 03/29/2019 04:21 AM    HGB 12.7 (L) 03/29/2019 04:21 AM    HCT 39.0 (L) 03/29/2019 04:21 AM    MCV 88.9 03/29/2019 04:21 AM    MCH 29.0 03/29/2019 04:21 AM    MCHC 32.6 03/29/2019 04:21 AM    RDW 14.9 03/29/2019 04:21 AM    PLTCT 341 03/29/2019 04:21 AM    MPV 7.2 03/29/2019 04:21 AM    Lab Results   Component Value Date/Time    NEUT 56 03/29/2019 04:21 AM    ANC 3.18 03/29/2019 04:21 AM    LYMA 34 03/29/2019 04:21 AM    ALC 1.87 03/29/2019 04:21 AM    MONA 7 03/29/2019 04:21 AM    AMC 0.37 03/29/2019 04:21 AM    EOSA 2 03/29/2019 04:21 AM    AEC 0.12 03/29/2019 04:21 AM    BASA 1 03/29/2019 04:21 AM    ABC 0.04 03/29/2019 04:21 AM        Comprehensive Metabolic Profile    Lab Results   Component Value Date/Time    NA 140  03/29/2019 04:21 AM    K 3.8 03/29/2019 04:21 AM    CL 104 03/29/2019 04:21 AM    CO2 32 (H) 03/29/2019 04:21 AM    GAP 4 03/29/2019 04:21 AM    BUN 18 03/29/2019 04:21 AM    CR 0.31 (L) 03/29/2019 04:21 AM    GLU 97 03/29/2019 04:21 AM    GLU 67 08/17/2016    Lab Results   Component Value Date/Time    CA 8.6 03/29/2019 04:21 AM    PO4 3.3 03/23/2019 04:16 AM    ALBUMIN 2.9 (L) 03/29/2019 04:21 AM    TOTPROT 6.4 03/29/2019 04:21 AM    ALKPHOS 67 03/29/2019 04:21 AM    AST 36 03/29/2019 04:21 AM    ALT 35 03/29/2019 04:21 AM    TOTBILI 0.2 (L) 03/29/2019 04:21 AM    GFR >60 03/29/2019 04:21 AM    GFRAA >60 03/29/2019 04:21 AM          Other pertinent lab and radiology reviewed and/or noted in Assessment and Plan.      Patient seen and discussed with Dr. Dorien Chihuahua MD  PGY1 Internal Medicine   [facility] [place]   [phone]

## 2019-03-30 LAB — CBC AND DIFF: Lab: 7.2 10*3/uL — ABNORMAL LOW (ref >60)

## 2019-03-30 MED ADMIN — WATER FOR INJECTION, STERILE IJ SOLN [79513]: 20 mL | INTRAVENOUS | @ 21:00:00 | Stop: 2019-03-30 | NDC 00409488723

## 2019-03-30 NOTE — Progress Notes
baclofen.  ???  Constipation:  > Daily docusate.  > PR bisacodyl Monday and Friday.  ???  FEN: No fluids, replace as necessary, Regular diet  DVT ppx: Lovenox  Code: DNAR-Li  Dispo: Admit to medicine. Anticipate discharge tomororw  ???        Patient seen and discussed with Dr. Merlyn Albert, DO  Internal Medicine PGY3  [id number]   Date:  03/30/2019                Subjective  Eric Cabrera is a 53 y.o. male.  Patient  Resting in bed this am. Does not want turns because he feels this is making his hypertension worse. No sweating, fevers, chills.          Medications  Scheduled Meds:ascorbic acid (VITAMIN C) tablet 500 mg, 500 mg, Oral, QDAY  baclofen (LIORESAL) tablet 40 mg, 40 mg, Oral, TID  diazePAM (VALIUM) tablet 2.5 mg, 2.5 mg, Oral, BID  docusate (COLACE) capsule 100 mg, 100 mg, Oral, BID  enoxaparin (LOVENOX) syringe 40 mg, 40 mg, Subcutaneous, QDAY(21)  ertapenem (INVanz) IVP 1 g, 1 g, Intravenous, Q24H*  levETIRAcetam (KEPPRA) tablet 750 mg, 750 mg, Oral, BID  senna (SENOKOT) tablet 1 tablet, 1 tablet, Oral, BID  zinc sulfate capsule 220 mg, 220 mg, Oral, QDAY    Continuous Infusions:  PRN and Respiratory Meds:vitamin A & D PRN      Objective                       Vital Signs: Last Filed                 Vital Signs: 24 Hour Range   BP: 136/88 (04/26 0725)  Temp: 36.4 ???C (97.6 ???F) (04/26 0725)  Pulse: 57 (04/26 0725)  Respirations: 16 PER MINUTE (04/26 0725)  SpO2: 97 % (04/26 0725) BP: (89-136)/(59-88)   Temp:  [36.3 ???C (97.3 ???F)-36.9 ???C (98.4 ???F)]   Pulse:  [57-90]   Respirations:  [14 PER MINUTE-16 PER MINUTE]   SpO2:  [96 %-98 %]      Vitals:    03/23/19 0200   Weight: 76.8 kg (169 lb 5 oz)           Physical Exam     53 y.o. male     Gen: Alert, oriented, NAD  Eyes; pupils equal, no scleral icterus  Cv: s1/ s2, no murmur  Pulm: CTAB  Abd: soft, NT, ND, + BS  Ext: no edema, rashes noted    Lab Review  CBC w/Diff    Lab Results   Component Value Date/Time    WBC 7.2 03/30/2019 04:30 AM

## 2019-03-31 DIAGNOSIS — G4089 Other seizures: ICD-10-CM

## 2019-03-31 LAB — CULTURE-ANAEROBIC

## 2019-03-31 MED ORDER — IMS MIXTURE TEMPLATE
30 mg | Freq: Three times a day (TID) | ORAL | 0 refills | Status: DC
Start: 2019-03-31 — End: 2019-04-01
  Administered 2019-03-31 – 2019-04-01 (×4): 30 mg via ORAL

## 2019-03-31 MED ORDER — METRONIDAZOLE 500 MG PO TAB
500 mg | Freq: Three times a day (TID) | ORAL | 0 refills | Status: DC
Start: 2019-03-31 — End: 2019-04-02
  Administered 2019-03-31 – 2019-04-02 (×6): 500 mg via ORAL

## 2019-03-31 MED ORDER — CEFTRIAXONE INJ 2GM IVP
2 g | INTRAVENOUS | 0 refills | Status: DC
Start: 2019-03-31 — End: 2019-04-02
  Administered 2019-03-31 – 2019-04-01 (×2): 2 g via INTRAVENOUS

## 2019-03-31 MED ORDER — BISACODYL 10 MG RE SUPP
10 mg | Freq: Every day | RECTAL | 0 refills | Status: DC | PRN
Start: 2019-03-31 — End: 2019-04-02
  Administered 2019-04-01: 02:00:00 10 mg via RECTAL

## 2019-03-31 NOTE — Progress Notes
for a catheter exchange, and that his home nurse has been unable to complete this due that nurse having contact with COVID.  > Continue PTA diazepam dosing  > Baclofen decreased to 30mg  TID from 4mg  TID  ???  Constipation:  > Daily docusate.  > PR bisacodyl Monday and Friday.  ???  FEN: No fluids, replace as necessary, Regular diet  DVT ppx: Lovenox  Code: DNAR-Li  Dispo: Admit to medicine. Anticipate discharge tomororw  ???        Patient seen and discussed with Dr. Loyce Dys MD  PGY1 Internal Medicine   [facility]   Pager- [phone]   Date:  03/31/2019                Subjective  Eric Cabrera is a 53 y.o. male.  Patient was frustrated this morning about being turned constantly; otherwise slept fine. He reports he is agreeable to         Medications  Scheduled Meds:ascorbic acid (VITAMIN C) tablet 500 mg, 500 mg, Oral, QDAY  baclofen (LIORESAL) tablet 30 mg, 30 mg, Oral, TID  cefTRIAXone (ROCEPHIN) IVP 2 g, 2 g, Intravenous, Q24H*  diazePAM (VALIUM) tablet 2.5 mg, 2.5 mg, Oral, BID  docusate (COLACE) capsule 100 mg, 100 mg, Oral, BID  enoxaparin (LOVENOX) syringe 40 mg, 40 mg, Subcutaneous, QDAY(21)  levETIRAcetam (KEPPRA) tablet 750 mg, 750 mg, Oral, BID  metroNIDAZOLE (FLAGYL) tablet 500 mg, 500 mg, Oral, TID  senna (SENOKOT) tablet 1 tablet, 1 tablet, Oral, BID  zinc sulfate capsule 220 mg, 220 mg, Oral, QDAY    Continuous Infusions:  PRN and Respiratory Meds:vitamin A & D PRN      Objective                       Vital Signs: Last Filed                 Vital Signs: 24 Hour Range   BP: 100/60 (04/27 0805)  Temp: 36.7 ???C (98 ???F) (04/27 0805)  Pulse: 65 (04/27 0805)  Respirations: 16 PER MINUTE (04/27 0805)  SpO2: 98 % (04/27 0805) BP: (91-114)/(54-77)   Temp:  [36.3 ???C (97.4 ???F)-36.7 ???C (98 ???F)]   Pulse:  [62-80]   Respirations:  [16 PER MINUTE]   SpO2:  [97 %-99 %]      Vitals:    03/23/19 0200   Weight: 76.8 kg (169 lb 5 oz)           Physical Exam     53 y.o. male

## 2019-03-31 NOTE — Care Plan
Problem: Nutrition Deficit  Goal: Adequate nutritional intake  Flowsheets (Taken 03/31/2019 1510)  Adequate nutritional intake:   Promote oral fluid intake   Assess nutritional status  RD met with pt at bedside this afternoon. No changes in nutrition recommendations or interventions since previous nutrition assessment. Pt reported today that his appetite and meal intake have been improving today. Pt noted decreased intake over the weekend. He reports he has been trying to consume oral supplements 1-2x/day. Kcal count noted- see results below for 4 day average intake. RD reinforced the importance of a consistent intake of high protein meals throughout the day plus 2-3 boost plus supplement with or between meals. Pt agreed. RD will discontinue calorie count. Will continue to monitor pt.     Intake Comment: 4-day PO intake Average (4/23-4/26)  Intake (calories) Daily Average : 1178 kilocalories (56-62% of estimated calorie needs)  Intake (protein) Daily Average : 77 grams (67-77% of estimated protein needs)     Sylvie Farrier RD, LD   Desk: [phone] Pager: [phone]  [place]: [phone]
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
## 2019-04-01 LAB — COMPREHENSIVE METABOLIC PANEL: Lab: 139 MMOL/L — ABNORMAL LOW (ref >60)

## 2019-04-01 MED ORDER — BACLOFEN 20 MG PO TAB
20 mg | Freq: Three times a day (TID) | ORAL | 0 refills | Status: DC
Start: 2019-04-01 — End: 2019-04-02
  Administered 2019-04-01 – 2019-04-02 (×3): 20 mg via ORAL

## 2019-04-01 MED ORDER — DIAZEPAM 5 MG PO TAB
2.5 mg | ORAL_TABLET | Freq: Two times a day (BID) | ORAL | 0 refills | 7.00000 days | Status: AC
Start: 2019-04-01 — End: ?

## 2019-04-01 MED ORDER — BACLOFEN 20 MG PO TAB
20 mg | Freq: Three times a day (TID) | ORAL | 0 refills | 30.00000 days | Status: DC
Start: 2019-04-01 — End: 2019-04-25

## 2019-04-01 MED ORDER — LEVETIRACETAM 750 MG PO TAB
750 mg | ORAL_TABLET | Freq: Two times a day (BID) | ORAL | 1 refills | 90.00000 days | Status: AC
Start: 2019-04-01 — End: ?

## 2019-04-01 MED ORDER — METRONIDAZOLE 500 MG PO TAB
500 mg | ORAL_TABLET | Freq: Three times a day (TID) | ORAL | 0 refills | Status: AC
Start: 2019-04-01 — End: ?

## 2019-04-01 MED ORDER — VITS A AND D-WHITE PET-LANOLIN TP OINT
1 g | TOPICAL | 0 refills | Status: AC | PRN
Start: 2019-04-01 — End: ?

## 2019-04-01 MED ORDER — CEFTRIAXONE INJ 2GM IVP
2 g | INTRAVENOUS | 0 refills | 2.00000 days | Status: AC
Start: 2019-04-01 — End: ?

## 2019-04-01 MED ORDER — ZINC SULFATE 220 (50) MG PO CAP
220 mg | ORAL_CAPSULE | Freq: Every day | ORAL | 0 refills | Status: AC
Start: 2019-04-01 — End: ?

## 2019-04-01 NOTE — Case Management (ED)
CMA Note:       Printed and placed transfer packet in the pt's wall unit per request from [facility].    Maia Petties  Case Management Assistant  For additional assistance please contact SWCM Tito Dine *[phone]

## 2019-04-01 NOTE — Progress Notes
Med 2 Progress Note    Name:  Eric Cabrera   Today's Date:  04/01/2019  Admission Date: 03/23/2019  LOS: 9 days                     Principal Problem:    Subacute osteomyelitis of right femur (HCC)  Active Problems:    Quadriplegic spinal paralysis (HCC)    Complicated UTI (urinary tract infection)    Neurogenic bladder    Suprapubic catheter (HCC)    Leukocytosis    Decubitus ulcer of sacral region, unstageable (HCC)    Nonintractable epilepsy (HCC)         Assessment and Plan:  Mr. Yeargan is a pleasant 53 year old male with history of quadriplegia, Epilepsy,???Multiple???Pressure Ulcers,???neurogenic bladder (w/ suprapubic cath) and tobacco use who presents as a transfer from [facility] for evaluation of witnessed 'seizure-like activity' by a caregiver.  ???  Wound of right hip  R Hip Abscess  Leukocytosis:  - PE showing mild redness over the right greater trochanter of his right hip. NO TTP however patient without sensation overlying this area.. Currently, there is no drainable collection that I could identify. Uncertain if current redness represents cellulitis, however given high risk and history of osteo of hip, will pursue infectious workup.  - History of osteomyelitis over stage IV right ischial decub ulcer 2015, however PE shows clean/dry tracts over these fistulas without any surrounding cellulitis. Recurrence in 2018 which required surgery by Dr. Sharlet Salina stone  - WBC @ OSH of 14.5, neutrophil percent of 77%.  - No obvious clinical signs of infection other than cellulitis. UA @ OSH with 1+ LE, 3+ blood, positive nitrate, 11-25 WBC, 3+ bacteria.  - DDx: celllulitis vs osteo vs pressure related redness  -???Ortho ???took???to OR for I&D 4/22???for R hip  - IR placed PICC 4/24  Plan  > Consulted???wound care; appreciate recs???  > Blood cultures  >???Plastic surgery consulted for repair of ischial wound; will need to be nutritionally optimized; recommend patient seeing them outpatient in Vital Signs: Last Filed                 Vital Signs: 24 Hour Range   BP: 143/79 (04/28 0833)  Temp: 36.7 ???C (98 ???F) (04/28 4782)  Pulse: 51 (04/28 0833)  Respirations: 16 PER MINUTE (04/28 0833)  SpO2: 98 % (04/28 0833) BP: (98-143)/(63-79)   Temp:  [36.4 ???C (97.6 ???F)-37 ???C (98.6 ???F)]   Pulse:  [51-95]   Respirations:  [15 PER MINUTE-18 PER MINUTE]   SpO2:  [96 %-100 %]      Vitals:    03/23/19 0200   Weight: 76.8 kg (169 lb 5 oz)           Physical Exam     53 y.o. male     General: alert, in no acute distress, quadriplegic  Eyes: No scleral icterus  Ears: Normal appearing external ears  Throat: No pharyngeal erythema identified, no lymphadenopathy  CV: HR RRR, no murmur, no rub/gallop  Pulm: Lungs CTA bilaterally, no wheezes/rhonci/rales auscultated  Abdomen: hypoactive bowel sound, soft, non-tender, distended abdomen with air  Extremities: No lower extremity edema idenitifed. Small areas of skin breakdown. Near coccyx patient has too fistulized tracts that look clean and intact. He has redness surrounding right greater trochanter with fluctuance that feels like subcutaneous edema.  Integumentary: No obvious rash or skin breakdown  Neuro: CN II-XII intact, oriented x 4  Psych: Normal mood and affect  Lab Review  CBC w/Diff    Lab Results   Component Value Date/Time    WBC 7.4 04/01/2019 04:00 AM    RBC 4.48 04/01/2019 04:00 AM    HGB 13.0 (L) 04/01/2019 04:00 AM    HCT 39.5 (L) 04/01/2019 04:00 AM    MCV 88.2 04/01/2019 04:00 AM    MCH 29.0 04/01/2019 04:00 AM    MCHC 32.9 04/01/2019 04:00 AM    RDW 15.1 (H) 04/01/2019 04:00 AM    PLTCT 297 04/01/2019 04:00 AM    MPV 7.3 04/01/2019 04:00 AM    Lab Results   Component Value Date/Time    NEUT 68 04/01/2019 04:00 AM    ANC 5.00 04/01/2019 04:00 AM    LYMA 24 04/01/2019 04:00 AM    ALC 1.76 04/01/2019 04:00 AM    MONA 6 04/01/2019 04:00 AM    AMC 0.43 04/01/2019 04:00 AM    EOSA 1 04/01/2019 04:00 AM    AEC 0.11 04/01/2019 04:00 AM

## 2019-04-01 NOTE — Progress Notes
Pressure Injury Stages: Deep Tissue Pressure Injury   If this pressure injury is suspected to be device related, please select the device::    Wound Image   04/01/2019 10:35 AM   Agree With My Assessment? Yes 04/01/2019  5:14 AM   Wound Dressing Status Open to Air 04/01/2019 10:35 AM   Wound Dressing and / or Treatment A & D Ointment 04/01/2019 10:35 AM   Wound Drainage Amount None 04/01/2019 10:35 AM   Wound Base Assessment Intact;Red;Dry 04/01/2019 10:35 AM   Surrounding Skin Assessment Dry;Intact;Pink 04/01/2019 10:35 AM   Wound Length (cm) 1.3 cm 04/01/2019 10:35 AM   Wound Width (cm) 1 cm 04/01/2019 10:35 AM   Wound Depth (cm) 0 cm 04/01/2019 10:35 AM   Wound Surface Area (cm^2) 1.3 cm^2 04/01/2019 10:35 AM   Wound Volume (cm^3) 0 cm^3 04/01/2019 10:35 AM       Pressure Injury 03/23/19 0200 Yes Right Ischium Stage 3 (Active)   03/23/19 0200    Pressure Injury Present On Inpatient Admission: Y   Pressure Injury Orientation: Right   Wound Location: Ischium   Pressure Injury Stages: Stage 3   If this pressure injury is suspected to be device related, please select the device::    Wound Image   04/01/2019 10:35 AM   Wound Dressing Status Changed 04/01/2019 10:35 AM   Wound Dressing and / or Treatment Normal Saline - Moist to Dry;Abdominal Pad;Hypafix Tape 04/01/2019 10:35 AM   Wound Drainage Description Serosanguineous 04/01/2019 10:35 AM   Wound Drainage Amount Scant 04/01/2019 10:35 AM   Wound Base Assessment Moist;Red;Yellow;Clean 04/01/2019 10:35 AM   Surrounding Skin Assessment Dry;Intact;Pink 04/01/2019 10:35 AM   Wound Length (cm) 2.8 cm 04/01/2019 10:35 AM   Wound Width (cm) 1.1 cm 04/01/2019 10:35 AM   Wound Depth (cm) 1.6 cm 04/01/2019 10:35 AM   Wound Surface Area (cm^2) 3.08 cm^2 04/01/2019 10:35 AM   Wound Volume (cm^3) 4.93 cm^3 04/01/2019 10:35 AM   Wound Healing % (Wound Team Only) -14.12 04/01/2019 10:35 AM   Tunneling in CM (Wound Team Only) 2.1 cm 04/01/2019 10:35 AM   Tunnelling Location  11 04/01/2019 10:35 AM Wound Healing % (Wound Team Only) 0 04/01/2019 10:35 AM       Pressure Injury 03/23/19 0200 Yes Right;Posterior Ankle Unstageable (Active)   03/23/19 0200    Pressure Injury Present On Inpatient Admission: Y   Pressure Injury Orientation: Right;Posterior   Wound Location: Ankle   Pressure Injury Stages: Unstageable   If this pressure injury is suspected to be device related, please select the device::    Wound Image   04/01/2019 10:35 AM   Agree With My Assessment? Yes 04/01/2019  5:14 AM   Wound Dressing Status Open to Air 04/01/2019 10:35 AM   Wound Dressing and / or Treatment A & D Ointment 04/01/2019 10:35 AM   Wound Drainage Amount None 04/01/2019 10:35 AM   Wound Base Assessment Red;Pink 04/01/2019 10:35 AM   Surrounding Skin Assessment Dry;Intact 04/01/2019 10:35 AM   Wound Length (cm) 1.5 cm 04/01/2019 10:35 AM   Wound Width (cm) 0.8 cm 04/01/2019 10:35 AM   Wound Depth (cm) 0.1 cm 04/01/2019 10:35 AM   Wound Surface Area (cm^2) 1.2 cm^2 04/01/2019 10:35 AM   Wound Volume (cm^3) 0.12 cm^3 04/01/2019 10:35 AM       Pressure Injury 03/23/19 0200 Yes Right;Lower Heel Stage 2 (Active)   03/23/19 0200    Pressure Injury Present On Inpatient Admission: Y  Pressure Injury Orientation: Right;Lower   Wound Location: Heel   Pressure Injury Stages: Stage 2   If this pressure injury is suspected to be device related, please select the device::    Wound Image   04/01/2019 10:35 AM   Agree With My Assessment? Yes 04/01/2019  5:14 AM   Wound Dressing Status Changed 04/01/2019 10:35 AM   Wound Dressing and / or Treatment A & D Ointment;Abdominal Pad;Kerlix 04/01/2019 10:35 AM   Wound Drainage Amount None 04/01/2019 10:35 AM   Wound Base Assessment Intact;Eschar 04/01/2019 10:35 AM   Surrounding Skin Assessment Dry;Intact;Pink 04/01/2019 10:35 AM   Wound Length (cm) 1 cm 04/01/2019 10:35 AM   Wound Width (cm) 2.8 cm 04/01/2019 10:35 AM   Wound Depth (cm) 0 cm 04/01/2019 10:35 AM   Wound Surface Area (cm^2) 2.8 cm^2 04/01/2019 10:35 AM Wound Volume (cm^3) 0 cm^3 04/01/2019 10:35 AM   Wound Healing % (Wound Team Only) 100 04/01/2019 10:35 AM       Pressure Injury 03/23/19 0200 Yes Right;Inner Ankle Stage 2 (Active)   03/23/19 0200    Pressure Injury Present On Inpatient Admission: Y   Pressure Injury Orientation: Right;Inner   Wound Location: Ankle   Pressure Injury Stages: Stage 2   If this pressure injury is suspected to be device related, please select the device::    Wound Image   04/01/2019 10:35 AM   Wound Dressing Status Open to Air 04/01/2019 10:35 AM   Wound Dressing and / or Treatment A & D Ointment 04/01/2019 10:35 AM   Wound Drainage Amount None 04/01/2019 10:35 AM   Wound Base Assessment Red;Dry 04/01/2019 10:35 AM   Surrounding Skin Assessment Dry;Intact;Pink 04/01/2019 10:35 AM   Wound Length (cm) 0.5 cm 04/01/2019 10:35 AM   Wound Width (cm) 0.5 cm 04/01/2019 10:35 AM   Wound Surface Area (cm^2) 0.25 cm^2 04/01/2019 10:35 AM   Wound Volume (cm^3) 0.04 cm^3 03/24/2019  2:00 PM       Pressure Injury 03/24/19 1400 Yes Left Ischium Deep Tissue Pressure Injury (Active)   03/24/19 1400    Pressure Injury Present On Inpatient Admission: Y   Pressure Injury Orientation: Left   Wound Location: Ischium   Pressure Injury Stages: Deep Tissue Pressure Injury   If this pressure injury is suspected to be device related, please select the device::    Wound Image   04/01/2019 10:35 AM   Wound Dressing Status Intact 04/01/2019 10:35 AM   Wound Dressing and / or Treatment Foam (Biatain);A & D Ointment 04/01/2019 10:35 AM   Wound Drainage Amount None 04/01/2019 10:35 AM   Wound Base Assessment Intact;Red 04/01/2019 10:35 AM   Surrounding Skin Assessment Dry;Intact;Pink 04/01/2019 10:35 AM   Wound Length (cm) 1 cm 04/01/2019 10:35 AM   Wound Width (cm) 0.4 cm 04/01/2019 10:35 AM   Wound Depth (cm) 0 cm 04/01/2019 10:35 AM   Wound Surface Area (cm^2) 0.4 cm^2 04/01/2019 10:35 AM   Wound Volume (cm^3) 0 cm^3 04/01/2019 10:35 AM

## 2019-04-01 NOTE — Case Management (ED)
Case Management Progress Note    NAME:Eric Cabrera                          MRN: [id number]              DOB:08-29-1966          AGE: 53 y.o.  ADMISSION DATE: 03/23/2019             DAYS ADMITTED: LOS: 9 days      Today???s Date: 04/01/2019    Plan  Patient to discharge to [facility] swing bed unit today at 6:30pm via [facility].     Interventions  ? Support      ? Info or Referral      ? Discharge Planning   Discharge Planning: Skilled Nursing Facility     Bedside RN contacted on-call SW stating she did not receive PCS form for patient's 6:30pm transport as discussed with primary SW.  On-call SW partially filled out [facility] PCS form and emailed to bedside RN.  SW provided direction on how to fill out remainder of form and asked that bedside RN send facesheet with PCS to ensure [facility] receives all insurance information.  SW appreciates assistance.     ? Medication Needs     o Financial      ? Legal      ? Other        Disposition  ? Expected Discharge Date    Expected Discharge Date: 04/01/19  Expected Discharge Time: 1830  ? Transportation   Does the patient need discharge transport arranged?: No(patient has a Therapist, nutritional Name, Phone and Availability #1: brother  Transportation Name, Phone and Availability #2: caretaker  Does the patient use Medicaid Transportation?: No  ? Next Level of Care (Acute Psych discharges only)      ? Discharge Disposition          Durable Medical Equipment      No service has been selected for the patient.      [place] Destination - Selection Complete      Service Provider Request Status Selected Services Address Phone Number Fax Number    [facility] ACUTE CARE Selected Acute [facility] [address], [place] [address] [phone]      [facility]      No service has been selected for the patient.      [facility]      No service has been selected for the patient.        Windle Guard, LMSW  Inpatient Heart Failure Social Worker  Phone:  [phone]

## 2019-04-01 NOTE — Progress Notes
cefTRIAXone (ROCEPHIN) IVP 2 g, 2 g, Intravenous, Q24H*  diazePAM (VALIUM) tablet 2.5 mg, 2.5 mg, Oral, BID  docusate (COLACE) capsule 100 mg, 100 mg, Oral, BID  enoxaparin (LOVENOX) syringe 40 mg, 40 mg, Subcutaneous, QDAY(21)  levETIRAcetam (KEPPRA) tablet 750 mg, 750 mg, Oral, BID  metroNIDAZOLE (FLAGYL) tablet 500 mg, 500 mg, Oral, TID  senna (SENOKOT) tablet 1 tablet, 1 tablet, Oral, BID  zinc sulfate capsule 220 mg, 220 mg, Oral, QDAY    Continuous Infusions:    PRN and Respiratory Meds:bisacodyL QDAY PRN, vitamin A & D PRN    Physical Examination                          Vital Signs: Last                  Vital Signs: 24 Hour Range   BP: 143/79 (04/28 0833)  Temp: 36.7 ???C (98 ???F) (04/28 1610)  Pulse: 51 (04/28 0833)  Respirations: 16 PER MINUTE (04/28 0833)  SpO2: 98 % (04/28 0833) BP: (98-143)/(63-79)   Temp:  [36.4 ???C (97.6 ???F)-37 ???C (98.6 ???F)]   Pulse:  [51-95]   Respirations:  [15 PER MINUTE-18 PER MINUTE]   SpO2:  [96 %-100 %]      General : alert, oriented, NAD  HENT: MMM no thrush   Eyes: EOM grossly intact, Conj nl  Lungs: clear ant/laterally normal wob  Heart: RRR no murmur   Abdomen: soft, ND, NTTP  Ext:  No edema, atrophied BLE's  Skin: no rash, multiple foci of skin breakdown on patient's hands and feet  See wound care note for images and full characterization of patient's skin condition. Wound dressings CDI.    Lines:   PIVx2  PICC LUE (4/24) - site unremarkable    Laboratory   Hematology  Recent Labs     03/30/19  0430 03/31/19  0325 04/01/19  0400   WBC 7.2 7.8 7.4   HGB 13.2* 13.6 13.0*   HCT 40.1 41.4 39.5*   PLTCT 342 317 297     Chemistry  Recent Labs     03/30/19  0430 03/31/19  0325 04/01/19  0400   NA 136* 138 139   K 3.9 4.2 4.1   CL 101 102 101   CO2 29 30 32*   BUN 21 15 19    CR 0.25* 0.27* 0.24*   GFR >60 >60 >60   GLU 87 80 83   CA 8.6 9.1 8.9   ALBUMIN 3.0* 3.2* 3.2*   ALKPHOS 72 91 88   AST 37 52* 42*   ALT 42 52 51   TOTBILI 0.2* 0.2* 0.2*

## 2019-04-02 ENCOUNTER — Inpatient Hospital Stay: Admit: 2019-03-26 | Discharge: 2019-03-26 | Payer: MEDICARE

## 2019-04-02 ENCOUNTER — Inpatient Hospital Stay: Admit: 2019-03-23 | Discharge: 2019-03-23 | Payer: MEDICARE

## 2019-04-02 ENCOUNTER — Encounter: Admit: 2019-04-02 | Discharge: 2019-04-02 | Payer: MEDICARE

## 2019-04-02 ENCOUNTER — Inpatient Hospital Stay: Admit: 2019-03-25 | Discharge: 2019-03-25 | Payer: MEDICARE

## 2019-04-02 ENCOUNTER — Inpatient Hospital Stay: Admit: 2019-03-28 | Discharge: 2019-03-28 | Payer: MEDICARE

## 2019-04-02 DIAGNOSIS — R7982 Elevated C-reactive protein (CRP): ICD-10-CM

## 2019-04-02 DIAGNOSIS — Z8614 Personal history of Methicillin resistant Staphylococcus aureus infection: ICD-10-CM

## 2019-04-02 DIAGNOSIS — Z66 Do not resuscitate: ICD-10-CM

## 2019-04-02 DIAGNOSIS — G40909 Epilepsy, unspecified, not intractable, without status epilepticus: ICD-10-CM

## 2019-04-02 DIAGNOSIS — M86651 Other chronic osteomyelitis, right thigh: ICD-10-CM

## 2019-04-02 DIAGNOSIS — L89326 Pressure-induced deep tissue damage of left buttock: ICD-10-CM

## 2019-04-02 DIAGNOSIS — N319 Neuromuscular dysfunction of bladder, unspecified: ICD-10-CM

## 2019-04-02 DIAGNOSIS — L89890 Pressure ulcer of other site, unstageable: ICD-10-CM

## 2019-04-02 DIAGNOSIS — Z9359 Other cystostomy status: ICD-10-CM

## 2019-04-02 DIAGNOSIS — Z792 Long term (current) use of antibiotics: ICD-10-CM

## 2019-04-02 DIAGNOSIS — B954 Other streptococcus as the cause of diseases classified elsewhere: ICD-10-CM

## 2019-04-02 DIAGNOSIS — L89313 Pressure ulcer of right buttock, stage 3: ICD-10-CM

## 2019-04-02 DIAGNOSIS — L89154 Pressure ulcer of sacral region, stage 4: ICD-10-CM

## 2019-04-02 DIAGNOSIS — N390 Urinary tract infection, site not specified: ICD-10-CM

## 2019-04-02 DIAGNOSIS — G8250 Quadriplegia, unspecified: ICD-10-CM

## 2019-04-02 DIAGNOSIS — G904 Autonomic dysreflexia: ICD-10-CM

## 2019-04-02 DIAGNOSIS — M86151 Other acute osteomyelitis, right femur: Principal | ICD-10-CM

## 2019-04-02 DIAGNOSIS — K5900 Constipation, unspecified: ICD-10-CM

## 2019-04-02 DIAGNOSIS — F17210 Nicotine dependence, cigarettes, uncomplicated: ICD-10-CM

## 2019-04-02 DIAGNOSIS — Z87440 Personal history of urinary (tract) infections: ICD-10-CM

## 2019-04-02 DIAGNOSIS — H02401 Unspecified ptosis of right eyelid: ICD-10-CM

## 2019-04-02 DIAGNOSIS — L89612 Pressure ulcer of right heel, stage 2: ICD-10-CM

## 2019-04-02 LAB — AST (SGOT): Lab: 31

## 2019-04-02 LAB — CREATININE: Lab: 0.4

## 2019-04-02 LAB — PLATELET COUNT: Lab: 335

## 2019-04-02 LAB — ALK PHOS TOTAL: Lab: 101

## 2019-04-02 LAB — CBC: Lab: 8.1

## 2019-04-02 LAB — POTASSIUM: Lab: 4

## 2019-04-02 LAB — BUN: Lab: 13

## 2019-04-02 LAB — ALT (SGPT): Lab: 46

## 2019-04-02 LAB — HEMOGLOBIN: Lab: 13

## 2019-04-02 NOTE — Progress Notes
I have reviewed the notes, assessment, and/or procedures performed by Delilah Shan, RN and concur with her/his documentation unless otherwise noted.

## 2019-04-02 NOTE — Progress Notes
Reconciling Discharge Orders Note  OPAT ACTIVE  Pt discharged on: 04/01/19      ID physician: Colon Flattery  Next f/u: With ortho or wound, otherwise PRN    Dx: rt hip abscess         1. Antibiotic order:  Ceftriaxone 2g IV Q 24 HRS,   Start date 03/31/19 ,end date 04/08/19    Flagyl 500mg  TID PO  S: 03/31/19    E: 04/08/19    2. Weekly labs to be drawn every Monday and start on 04/07/19.  Weekly Labs: CBC w/Diff, CMP, prealbumin.  Fax results to [phone].     3. Weekly PICC care per protocol.    Line: [facility] PICC    Services:  [facility]  P: [phone]    F: [phone]    Orders confirmed with sheryl at [facility].  D/c orders given.

## 2019-04-02 NOTE — Discharge Instructions - Pharmacy
Wound Dressing and / or Treatment Normal Saline - Moist to Dry;Abdominal Pad;Hypafix Tape 04/01/2019 10:35 AM   Wound Drainage Description Serosanguineous 04/01/2019 10:35 AM   Wound Drainage Amount Scant 04/01/2019 10:35 AM   Wound Base Assessment Dressing intact, base not assessed 04/01/2019  8:00 PM   Surrounding Skin Assessment Dry;Intact;Pink 04/01/2019 10:35 AM   Wound Length (cm) 2.8 cm 04/01/2019 10:35 AM   Wound Width (cm) 1.1 cm 04/01/2019 10:35 AM   Wound Depth (cm) 1.6 cm 04/01/2019 10:35 AM   Wound Surface Area (cm^2) 3.08 cm^2 04/01/2019 10:35 AM   Wound Volume (cm^3) 4.93 cm^3 04/01/2019 10:35 AM   Wound Healing % (Wound Team Only) -14.12 04/01/2019 10:35 AM   Tunneling in CM (Wound Team Only) 2.1 cm 04/01/2019 10:35 AM   Tunnelling Location  11 04/01/2019 10:35 AM   Number of days: 10       Pressure Injury 03/23/19 0200 Yes Right;Inner Knee Unstageable (Active)   03/23/19 0200    Pressure Injury Present On Inpatient Admission: Y   Pressure Injury Orientation: Right;Inner   Wound Location: Knee   Pressure Injury Stages: Unstageable   If this pressure injury is suspected to be device related, please select the device::    Wound Image   04/01/2019 10:35 AM   Agree With My Assessment? Yes 04/01/2019  5:14 AM   Wound Dressing Status Intact 04/01/2019 10:35 AM   Wound Dressing and / or Treatment Foam (Biatain);A & D Ointment 04/01/2019 10:35 AM   Wound Drainage Description Serosanguineous 03/30/2019  9:00 PM   Wound Drainage Amount None 04/01/2019 10:35 AM   Wound Base Assessment Dressing intact, base not assessed 04/01/2019  8:00 PM   Surrounding Skin Assessment Dry;Intact;Pink 04/01/2019 10:35 AM   Wound Length (cm) 0.8 cm 04/01/2019 10:35 AM   Wound Width (cm) 0.8 cm 04/01/2019 10:35 AM   Wound Depth (cm) 0.2 cm 04/01/2019 10:35 AM   Wound Surface Area (cm^2) 0.64 cm^2 04/01/2019 10:35 AM   Wound Volume (cm^3) 0.13 cm^3 04/01/2019 10:35 AM   Number of days: 10 Wound Base Assessment Dressing intact, base not assessed 04/01/2019  8:00 PM   Surrounding Skin Assessment Dry;Intact;Pink 04/01/2019 10:35 AM   Wound Length (cm) 0.5 cm 04/01/2019 10:35 AM   Wound Width (cm) 0.5 cm 04/01/2019 10:35 AM   Wound Depth (cm) 0.1 cm 03/24/2019  2:00 PM   Wound Surface Area (cm^2) 0.25 cm^2 04/01/2019 10:35 AM   Wound Volume (cm^3) 0.04 cm^3 03/24/2019  2:00 PM   Number of days: 10       Pressure Injury 03/24/19 1400 Yes Left Ischium Deep Tissue Pressure Injury (Active)   03/24/19 1400    Pressure Injury Present On Inpatient Admission: Y   Pressure Injury Orientation: Left   Wound Location: Ischium   Pressure Injury Stages: Deep Tissue Pressure Injury   If this pressure injury is suspected to be device related, please select the device::    Wound Image   04/01/2019 10:35 AM   Agree With My Assessment? Yes 04/01/2019  5:14 AM   Wound Dressing Status Intact 04/01/2019 10:35 AM   Wound Dressing and / or Treatment Foam (Biatain);A & D Ointment 04/01/2019 10:35 AM   Wound Drainage Description Serosanguineous 03/31/2019  9:02 PM   Wound Drainage Amount None 04/01/2019 10:35 AM   Wound Base Assessment Dressing intact, base not assessed 04/01/2019  8:00 PM   Surrounding Skin Assessment Dry;Intact;Pink 04/01/2019 10:35 AM   Wound Length (cm) 1 cm 04/01/2019  10:35 AM   Wound Width (cm) 0.4 cm 04/01/2019 10:35 AM   Wound Depth (cm) 0 cm 04/01/2019 10:35 AM   Wound Surface Area (cm^2) 0.4 cm^2 04/01/2019 10:35 AM   Wound Volume (cm^3) 0 cm^3 04/01/2019 10:35 AM   Number of days: 9            ???    Condition at Discharge: Stable    Discharge Diagnoses:  Chronic osteomyelitis, pressure wounds, seizures     Hospital Problems        Active Problems    * (Principal) Subacute osteomyelitis of right femur (HCC)    Quadriplegic spinal paralysis (HCC)    Complicated UTI (urinary tract infection)    Neurogenic bladder    Suprapubic catheter (HCC)    Leukocytosis    Decubitus ulcer of sacral region, unstageable (HCC) starting in April 27    Was this approved by the inpatient consult service? Yes       Current Discharge Medication List       START taking these medications    Details   cefTRIAXone (ROCEPHIN) 2 g solr Administer 20 mL through vein every 24 hours for 7 days.  Qty: 140 mL, Refills: 0    PRESCRIPTION TYPE:  No Print      metroNIDAZOLE (FLAGYL) 500 mg tablet Take one tablet by mouth three times daily for 7 days. Take with food. Do not drink alcohol while on metronidazole.  Qty: 21 tablet, Refills: 0    PRESCRIPTION TYPE:  No Print      vitamin A & D oint Apply one g topically to affected area as Needed (wound care).  Qty: 454 g, Refills: 0    PRESCRIPTION TYPE:  No Print      zinc sulfate 220 mg (50 mg elemental zinc) capsule Take one capsule by mouth daily.  Qty: 30 capsule, Refills: 0    PRESCRIPTION TYPE:  No Print          CONTINUE these medications which have been CHANGED or REFILLED    Details   baclofen (LIORESAL) 20 mg tablet Take one tablet by mouth three times daily. Indications: muscle spasms caused by a spinal disease    PRESCRIPTION TYPE:  No Print      diazePAM (VALIUM) 5 mg tablet Take one-half tablet by mouth twice daily.  Qty: 15 tablet, Refills: 0    PRESCRIPTION TYPE:  No Print      levETIRAcetam (KEPPRA) 750 mg tablet Take one tablet by mouth twice daily. This is an increased dose from what you were taking previously  Qty: 60 tablet, Refills: 1    PRESCRIPTION TYPE:  Normal          CONTINUE these medications which have NOT CHANGED    Details   ascorbic acid (VITAMIN-C) 500 mg tablet Take 1 Tab by mouth daily.  Qty: 30 Tab, Refills: 0    PRESCRIPTION TYPE:  Normal      docusate (COLACE) 100 mg capsule Take 100 mg by mouth twice daily.    PRESCRIPTION TYPE:  Historical Med      NIFEdipine (PROCARDIA) 10 mg capsule May take Procardia 10mg  PRN if Blood Pressure is 140/90  Qty: 30 capsule, Refills: 0    PRESCRIPTION TYPE:  Normal           Scheduled appointments:    Jun 03, 2019  2:00 PM CDT

## 2019-04-03 ENCOUNTER — Encounter: Admit: 2019-04-03 | Discharge: 2019-04-03 | Payer: MEDICARE

## 2019-04-08 ENCOUNTER — Encounter: Admit: 2019-04-08 | Discharge: 2019-04-08 | Payer: MEDICARE

## 2019-04-10 ENCOUNTER — Encounter: Admit: 2019-04-10 | Discharge: 2019-04-10 | Payer: MEDICARE

## 2019-04-10 LAB — PREALBUMIN: Lab: 25

## 2019-04-11 ENCOUNTER — Encounter: Admit: 2019-04-11 | Discharge: 2019-04-11 | Payer: MEDICARE

## 2019-04-11 ENCOUNTER — Inpatient Hospital Stay: Admit: 2019-04-11 | Discharge: 2019-04-11 | Payer: MEDICARE

## 2019-04-11 DIAGNOSIS — L89150 Pressure ulcer of sacral region, unstageable: Secondary | ICD-10-CM

## 2019-04-11 DIAGNOSIS — R569 Unspecified convulsions: ICD-10-CM

## 2019-04-11 DIAGNOSIS — T148XXA Other injury of unspecified body region, initial encounter: ICD-10-CM

## 2019-04-11 MED ORDER — DIAZEPAM 5 MG PO TAB
2.5 mg | Freq: Two times a day (BID) | ORAL | 0 refills | Status: DC
Start: 2019-04-11 — End: 2019-04-12
  Administered 2019-04-12 (×2): 2.5 mg via ORAL

## 2019-04-11 MED ORDER — ENOXAPARIN 40 MG/0.4 ML SC SYRG
40 mg | Freq: Every day | SUBCUTANEOUS | 0 refills | Status: DC
Start: 2019-04-11 — End: 2019-04-26
  Administered 2019-04-12 – 2019-04-26 (×15): 40 mg via SUBCUTANEOUS

## 2019-04-11 MED ORDER — DOCUSATE SODIUM 100 MG PO CAP
100 mg | Freq: Two times a day (BID) | ORAL | 0 refills | Status: DC
Start: 2019-04-11 — End: 2019-04-12

## 2019-04-11 MED ORDER — ASCORBIC ACID (VITAMIN C) 500 MG PO TAB
500 mg | Freq: Every day | ORAL | 0 refills | Status: DC
Start: 2019-04-11 — End: 2019-04-26
  Administered 2019-04-12 – 2019-04-25 (×14): 500 mg via ORAL

## 2019-04-11 MED ORDER — BACLOFEN 20 MG PO TAB
20 mg | Freq: Three times a day (TID) | ORAL | 0 refills | Status: DC
Start: 2019-04-11 — End: 2019-04-12
  Administered 2019-04-12 (×2): 20 mg via ORAL

## 2019-04-11 MED ORDER — POTASSIUM CHLORIDE 20 MEQ PO TBTQ
40 meq | Freq: Once | ORAL | 0 refills | Status: CP
Start: 2019-04-11 — End: ?
  Administered 2019-04-12: 02:00:00 40 meq via ORAL

## 2019-04-11 MED ORDER — ZINC SULFATE 220 (50) MG PO CAP
220 mg | Freq: Every day | ORAL | 0 refills | Status: DC
Start: 2019-04-11 — End: 2019-04-26
  Administered 2019-04-12 – 2019-04-25 (×12): 220 mg via ORAL

## 2019-04-11 MED ORDER — LEVETIRACETAM 500 MG PO TAB
750 mg | Freq: Two times a day (BID) | ORAL | 0 refills | Status: DC
Start: 2019-04-11 — End: 2019-04-26
  Administered 2019-04-12 – 2019-04-26 (×29): 750 mg via ORAL

## 2019-04-11 MED ORDER — NIFEDIPINE 10 MG PO CAP
10 mg | Freq: Every day | ORAL | 0 refills | Status: DC | PRN
Start: 2019-04-11 — End: 2019-04-17
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
## 2019-04-12 LAB — CBC AND DIFF
Lab: 0 10*3/uL (ref 0–0.20)
Lab: 0.3 10*3/uL (ref 0–0.45)
Lab: 7 10*3/uL (ref 4.5–11.0)
Lab: 4.4 M/UL (ref >60)
Lab: 6.7 K/UL (ref >60)

## 2019-04-12 LAB — C DIFFICILE BY PCR: Lab: NEGATIVE

## 2019-04-12 LAB — C REACTIVE PROTEIN (CRP): Lab: 0.8 mg/dL (ref <1.0)

## 2019-04-12 LAB — COMPREHENSIVE METABOLIC PANEL
Lab: 141 MMOL/L — ABNORMAL LOW (ref 137–147)
Lab: >60 mL/min (ref >60)
Lab: 139 MMOL/L — ABNORMAL LOW (ref >60)

## 2019-04-12 LAB — URINALYSIS DIPSTICK
Lab: NEGATIVE U/L (ref 7–40)
Lab: POSITIVE mL/min — AB (ref 1.0–4.8)

## 2019-04-12 LAB — SED RATE: Lab: 63 mm/h — ABNORMAL HIGH (ref 0–20)

## 2019-04-12 LAB — PREALBUMIN: Lab: 24 mg/dL (ref 17–34)

## 2019-04-12 LAB — URINALYSIS, MICROSCOPIC

## 2019-04-12 MED ORDER — BACLOFEN 20 MG PO TAB
40 mg | Freq: Three times a day (TID) | ORAL | 0 refills | Status: DC
Start: 2019-04-12 — End: 2019-04-26
  Administered 2019-04-12 – 2019-04-26 (×39): 40 mg via ORAL

## 2019-04-12 MED ORDER — LOPERAMIDE 2 MG PO CAP
2 mg | ORAL | 0 refills | Status: DC | PRN
Start: 2019-04-12 — End: 2019-04-26
  Administered 2019-04-14: 02:00:00 2 mg via ORAL

## 2019-04-12 MED ORDER — DIAZEPAM 5 MG PO TAB
5 mg | Freq: Two times a day (BID) | ORAL | 0 refills | Status: DC
Start: 2019-04-12 — End: 2019-04-26
  Administered 2019-04-13 – 2019-04-26 (×26): 5 mg via ORAL

## 2019-04-12 MED ORDER — BACLOFEN 20 MG PO TAB
20 mg | Freq: Once | ORAL | 0 refills | Status: CP
Start: 2019-04-12 — End: ?
  Administered 2019-04-12: 16:00:00 20 mg via ORAL

## 2019-04-12 MED ORDER — DIAZEPAM 5 MG PO TAB
2.5 mg | Freq: Once | ORAL | 0 refills | Status: CP
Start: 2019-04-12 — End: ?
  Administered 2019-04-12: 16:00:00 2.5 mg via ORAL

## 2019-04-12 NOTE — Consults
Surgical History:   Procedure Laterality Date   ??? PRESSURE ULCER DEBRIDEMENT  10/03/2017   ??? DEBRIDEMENT OPEN WOUND 20 SQ CM OR LESS - LOWER EXTREMITY Right 03/26/2019    Performed by Heddings, Revonda Standard, MD at [facility] OR   ??? BACLOFEN PUMP IMPLANTATION         Social History   Marital status/area of residence: At his home with a visiting care giver.    Social History     Tobacco Use   ??? Smoking status: Current Every Day Smoker     Packs/day: 0.50     Years: 20.00     Pack years: 10.00     Types: Cigarettes, Cigars   ??? Smokeless tobacco: Never Used   Substance Use Topics   ??? Alcohol use: Yes     Alcohol/week: 1.0 standard drinks     Types: 1 Cans of beer per week     Comment: Drinks rarely       Family History     Family History   Problem Relation Age of Onset   ??? Heart Attack Mother    ??? Heart Attack Maternal Aunt    ??? Heart Attack Maternal Uncle        Allergies     Allergies   Allergen Reactions   ??? Sulfa (Sulfonamide Antibiotics) REDNESS       Review of Systems   A comprehensive 14-point review of systems was negative with exception of: Loose stools, baseline quadriplegia and decubitus ulcers      Medications   Scheduled Meds:ascorbic acid (VITAMIN C) tablet 500 mg, 500 mg, Oral, QDAY  baclofen (LIORESAL) tablet 40 mg, 40 mg, Oral, TID  diazePAM (VALIUM) tablet 5 mg, 5 mg, Oral, BID  enoxaparin (LOVENOX) syringe 40 mg, 40 mg, Subcutaneous, QDAY(21)  levETIRAcetam (KEPPRA) tablet 750 mg, 750 mg, Oral, BID  zinc sulfate capsule 220 mg, 220 mg, Oral, QDAY    Continuous Infusions:  PRN and Respiratory Meds:NIFEdipine QDAY PRN      Physical Examination                          Vital Signs: Last                  Vital Signs: 24 Hour Range   BP: 108/69 (05/09 1120)  Temp: 36.7 ???C (98 ???F) (05/09 1120)  Pulse: 69 (05/09 1120)  Respirations: 18 PER MINUTE (05/09 1120)  SpO2: 95 % (05/09 1120) BP: (84-138)/(52-79)   Temp:  [36.2 ???C (97.2 ???F)-36.8 ???C (98.2 ???F)]   Pulse:  [58-69]   Respirations:  [16 PER MINUTE-18 PER MINUTE] SpO2:  [95 %-97 %]      General : NAD  HENT:  no oral lesions/thrush  Eyes: Conj nl  Lungs: no wheezing, rhonchi, rales appreciated  Heart: Regular rhythm, reg rate, with no murmur, rub, gallop  Abdomen: soft, non-tender, non-distended  Ext:  No clubbing, cyanosis or edema  Skin: Tunneled wound overlying right hip area packed and when packing removed tunells deep to bone. Open wound on Rt ischium without surrounding cellulitis or drainage.  Neuro: Quadriplegic    Lab Review   Hematology  Recent Labs     04/11/19  2050 04/12/19  0456   WBC 7.0 6.7   HGB 12.6* 13.1*   HCT 38.4* 39.2*   PLTCT 259 270     Chemistry  Recent Labs     04/11/19  2050 04/12/19  0456  NA 141 139   K 3.8 4.7   CL 104 104   CO2 31* 29   BUN 13 14   CR 0.25* 0.26*   GFR >60 >60   GLU 104* 83   CA 8.9 9.2   ALBUMIN 3.2* 3.3*   ALKPHOS 56 65   AST 16 16   ALT 15 13   TOTBILI 0.3 0.2*       Microbiology, Radiology and other Diagnostics Review     Microbiology data reviewed.  Pertinent radiology images viewed.       Brodi Nery I Colon Flattery, MD   Pager [phone]  Infectious Diseases Faculty
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
## 2019-04-13 LAB — GRAM STAIN

## 2019-04-13 LAB — CBC AND DIFF: Lab: 7.3 K/UL — ABNORMAL HIGH (ref 4.5–11.0)

## 2019-04-13 LAB — FOLATE, SERUM: Lab: 8.2 ng/mL — ABNORMAL LOW (ref >3.9)

## 2019-04-13 LAB — VITAMIN B12: Lab: 598 pg/mL — ABNORMAL HIGH (ref >60)

## 2019-04-13 LAB — IRON + BINDING CAPACITY + %SAT+ FERRITIN: Lab: 59 ug/dL — ABNORMAL LOW (ref >60)

## 2019-04-13 LAB — COMPREHENSIVE METABOLIC PANEL: Lab: 137 MMOL/L — ABNORMAL LOW (ref 137–147)

## 2019-04-13 MED ORDER — SODIUM CHLORIDE 0.9 % IV SOLP
1000 mL | INTRAVENOUS | 0 refills | Status: CP
Start: 2019-04-13 — End: ?
  Administered 2019-04-13: 22:00:00 1000 mL via INTRAVENOUS
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
## 2019-04-14 ENCOUNTER — Encounter: Admit: 2019-04-14 | Discharge: 2019-04-14 | Payer: MEDICARE

## 2019-04-14 DIAGNOSIS — N319 Neuromuscular dysfunction of bladder, unspecified: ICD-10-CM

## 2019-04-14 DIAGNOSIS — R569 Unspecified convulsions: ICD-10-CM

## 2019-04-14 DIAGNOSIS — Z960 Presence of urogenital implants: ICD-10-CM

## 2019-04-14 DIAGNOSIS — N390 Urinary tract infection, site not specified: ICD-10-CM

## 2019-04-14 DIAGNOSIS — Z9359 Other cystostomy status: ICD-10-CM

## 2019-04-14 DIAGNOSIS — M792 Neuralgia and neuritis, unspecified: ICD-10-CM

## 2019-04-14 DIAGNOSIS — G8250 Quadriplegia, unspecified: Principal | ICD-10-CM

## 2019-04-14 DIAGNOSIS — S14105A Unspecified injury at C5 level of cervical spinal cord, initial encounter: ICD-10-CM

## 2019-04-14 DIAGNOSIS — Z720 Tobacco use: ICD-10-CM

## 2019-04-14 DIAGNOSIS — L8990 Pressure ulcer of unspecified site, unspecified stage: ICD-10-CM

## 2019-04-14 DIAGNOSIS — Z978 Presence of other specified devices: ICD-10-CM

## 2019-04-14 LAB — CBC AND DIFF
Lab: 4.4 M/UL — ABNORMAL HIGH (ref 4.4–5.5)
Lab: 6 K/UL — ABNORMAL HIGH (ref 4.5–11.0)

## 2019-04-14 LAB — COMPREHENSIVE METABOLIC PANEL: Lab: 139 MMOL/L — ABNORMAL LOW (ref 137–147)

## 2019-04-14 MED ORDER — VITS A AND D-WHITE PET-LANOLIN TP OINT
Freq: Two times a day (BID) | TOPICAL | 0 refills | Status: DC
Start: 2019-04-14 — End: 2019-04-26
  Administered 2019-04-14: 20:00:00 via TOPICAL

## 2019-04-14 MED ORDER — BISACODYL 10 MG RE SUPP
10 mg | Freq: Every day | RECTAL | 0 refills | Status: DC | PRN
Start: 2019-04-14 — End: 2019-04-26
  Administered 2019-04-15 – 2019-04-22 (×3): 10 mg via RECTAL

## 2019-04-14 NOTE — Progress Notes
PHYSICAL THERAPY  NOTE      Name: Eric Cabrera        MRN: [id number]          DOB: [birth year]          Age: 53 y.o.  Admission Date: 04/11/2019             LOS: 2 days          Patient is quadriplegic and is dependent for all care at baseline requiring total assist to change body position and to get up to chair via mechanical lift.???Patient has necessary equipment and care at home no therapy needs at this time. Physical therapy services will be discontinued at this time, please re-consult if the patient has a change in mobility status.        Therapist: Lynett Grimes, PT, DPT   Date: 04/14/2019

## 2019-04-14 NOTE — Progress Notes
Plastic Surgery Progress Note    04/14/2019            S: No acute concerns overnight.  Sleeping well this morning    O:                   Vital Signs: Last Filed                Vital Signs: 24 Hour Range   BP: 125/77 (05/11 0341)  Temp: 36.6 ???C (97.8 ???F) (05/11 0341)  Pulse: 51 (05/11 0341)  Respirations: 16 PER MINUTE (05/11 0341)  SpO2: 97 % (05/11 0341)  BP: (87-125)/(49-77)   Temp:  [36.6 ???C (97.8 ???F)-36.8 ???C (98.2 ???F)]   Pulse:  [51-68]   Respirations:  [16 PER MINUTE-18 PER MINUTE]   SpO2:  [94 %-97 %]      Constitutional: Well-developed and well-nourished. No acute distress.  Head: Normocephalic and atraumatic.  Pulmonary/Chest: No respiratory distress, non-labored  Abdominal: Soft, nondistended, nontender, and no mass.   Musculoskeletal: Normal range of motion. No edema.  Neurological: Alert and oriented to person, place and time. No cranial nerve deficit.  Skin: Warm and dry. No rash noted. No erythema. No pallor.  R ischial wound, stage IV, no sharp prominences, surrounding skin healthy  Lateral small wound which tracks deeply to trochanter, serous fluid expressed, no purulence dressing intact            Active Problems:    Quadriplegic spinal paralysis (HCC)    Acute encephalopathy    Suprapubic catheter (HCC)    Seizure (HCC)    Decubitus ulcer of sacral region, unstageable [facility])        A/P:Eric Cabrera is a 53 y.o. Male with  Hx Quadriplegia, now with R ischial and trochanteric PU     - Continue low air loss mattress  - Prealbumin excellent, continue nutritional optimization  - Continue local wound care  - Will plan for surgery Wednesday 5/13 for debridement of wounds, ostectomy, possible flap coverage    Raliegh Ip PA-C  If questions, page please page plastic surgery [facility] team   [phone] (6AM-6PM M-F)   On-call resident all other times or consult pager [phone]
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
## 2019-04-15 ENCOUNTER — Inpatient Hospital Stay: Admit: 2019-04-15 | Discharge: 2019-04-15 | Payer: MEDICARE

## 2019-04-15 DIAGNOSIS — L89150 Pressure ulcer of sacral region, unstageable: Secondary | ICD-10-CM

## 2019-04-15 LAB — CULTURE-URINE W/SENSITIVITY
Lab: <10 U/L (ref 25–110)
Lab: <10 mg/dL — AB (ref 0.3–1.2)

## 2019-04-15 LAB — CULTURE-FECES W/SENSITIVITY

## 2019-04-15 LAB — CBC AND DIFF: Lab: 6.8 K/UL — ABNORMAL LOW (ref 4.5–11.0)

## 2019-04-15 LAB — COMPREHENSIVE METABOLIC PANEL: Lab: 137 MMOL/L — ABNORMAL LOW (ref 137–147)
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
## 2019-04-16 ENCOUNTER — Encounter: Admit: 2019-04-16 | Discharge: 2019-04-16 | Payer: MEDICARE

## 2019-04-16 DIAGNOSIS — Z960 Presence of urogenital implants: ICD-10-CM

## 2019-04-16 DIAGNOSIS — N319 Neuromuscular dysfunction of bladder, unspecified: ICD-10-CM

## 2019-04-16 DIAGNOSIS — G8250 Quadriplegia, unspecified: Secondary | ICD-10-CM

## 2019-04-16 DIAGNOSIS — M792 Neuralgia and neuritis, unspecified: ICD-10-CM

## 2019-04-16 DIAGNOSIS — Z720 Tobacco use: ICD-10-CM

## 2019-04-16 DIAGNOSIS — R569 Unspecified convulsions: ICD-10-CM

## 2019-04-16 DIAGNOSIS — N390 Urinary tract infection, site not specified: ICD-10-CM

## 2019-04-16 DIAGNOSIS — S14105A Unspecified injury at C5 level of cervical spinal cord, initial encounter: ICD-10-CM

## 2019-04-16 DIAGNOSIS — Z9359 Other cystostomy status: ICD-10-CM

## 2019-04-16 DIAGNOSIS — L8990 Pressure ulcer of unspecified site, unspecified stage: ICD-10-CM

## 2019-04-16 DIAGNOSIS — Z978 Presence of other specified devices: ICD-10-CM

## 2019-04-16 LAB — GRAM STAIN

## 2019-04-16 LAB — CBC AND DIFF: Lab: 6.6 10*3/uL (ref 4.5–11.0)

## 2019-04-16 LAB — COMPREHENSIVE METABOLIC PANEL: Lab: 139 MMOL/L — ABNORMAL LOW (ref >60)

## 2019-04-16 MED ORDER — VANCOMYCIN 1,250 MG IVPB
1250 mg | Freq: Two times a day (BID) | INTRAVENOUS | 0 refills | Status: DC
Start: 2019-04-16 — End: 2019-04-18
  Administered 2019-04-16 – 2019-04-18 (×10): 1250 mg via INTRAVENOUS

## 2019-04-16 MED ORDER — SODIUM HYPOCHLORITE 0.125 % MISC SOLN
Freq: Two times a day (BID) | 0 refills | Status: DC
Start: 2019-04-16 — End: 2019-04-26
  Administered 2019-04-17: 03:00:00 473.000 mL

## 2019-04-16 MED ORDER — LACTATED RINGERS IV SOLP
INTRAVENOUS | 0 refills | Status: DC
Start: 2019-04-16 — End: 2019-04-16

## 2019-04-16 MED ORDER — SODIUM HYPOCHLORITE 0.5 % MISC SOLN
0 refills | Status: DC
Start: 2019-04-16 — End: 2019-04-16
  Administered 2019-04-16: 18:00:00 1000 mL

## 2019-04-16 MED ORDER — VANCOMYCIN PHARMACY TO MANAGE
1 | 0 refills | Status: DC
Start: 2019-04-16 — End: 2019-04-26

## 2019-04-16 MED ORDER — GLYCOPYRROLATE 0.2 MG/ML IJ SOLN
0 refills | Status: DC
Start: 2019-04-16 — End: 2019-04-16
  Administered 2019-04-16: 17:00:00 0.2 mg via INTRAVENOUS

## 2019-04-16 MED ORDER — PROPOFOL INJ 10 MG/ML IV VIAL
0 refills | Status: DC
Start: 2019-04-16 — End: 2019-04-16
  Administered 2019-04-16: 17:00:00 100 mg via INTRAVENOUS

## 2019-04-16 MED ORDER — OXYCODONE 5 MG PO TAB
5-10 mg | Freq: Once | ORAL | 0 refills | Status: DC | PRN
Start: 2019-04-16 — End: 2019-04-16

## 2019-04-16 MED ORDER — ROCURONIUM 10 MG/ML IV SOLN
INTRAVENOUS | 0 refills | Status: DC
Start: 2019-04-16 — End: 2019-04-16
  Administered 2019-04-16: 17:00:00 40 mg via INTRAVENOUS

## 2019-04-16 MED ORDER — EPHEDRINE SULFATE 50 MG/5ML SYR (10 MG/ML) (AN)(OSM)
0 refills | Status: DC
Start: 2019-04-16 — End: 2019-04-16
  Administered 2019-04-16 (×2): 5 mg via INTRAVENOUS

## 2019-04-16 MED ORDER — FENTANYL CITRATE (PF) 50 MCG/ML IJ SOLN
0 refills | Status: DC
Start: 2019-04-16 — End: 2019-04-16
  Administered 2019-04-16 (×2): 50 ug via INTRAVENOUS

## 2019-04-16 MED ORDER — LIDOCAINE (PF) 200 MG/10 ML (2 %) IJ SYRG
0 refills | Status: DC
Start: 2019-04-16 — End: 2019-04-16
  Administered 2019-04-16: 17:00:00 100 mg via INTRAVENOUS

## 2019-04-16 MED ORDER — FENTANYL CITRATE (PF) 50 MCG/ML IJ SOLN
50 ug | INTRAVENOUS | 0 refills | Status: DC | PRN
Start: 2019-04-16 — End: 2019-04-16

## 2019-04-16 MED ORDER — DIPHENHYDRAMINE HCL 50 MG/ML IJ SOLN
25 mg | Freq: Once | INTRAVENOUS | 0 refills | Status: DC | PRN
Start: 2019-04-16 — End: 2019-04-16

## 2019-04-16 MED ORDER — SUGAMMADEX 100 MG/ML IV SOLN
INTRAVENOUS | 0 refills | Status: DC
Start: 2019-04-16 — End: 2019-04-16
  Administered 2019-04-16: 18:00:00 180 mg via INTRAVENOUS

## 2019-04-16 MED ORDER — LIDOCAINE (PF) 10 MG/ML (1 %) IJ SOLN
.1-2 mL | INTRAMUSCULAR | 0 refills | Status: DC | PRN
Start: 2019-04-16 — End: 2019-04-16

## 2019-04-16 MED ORDER — CEFAZOLIN 1 GRAM IJ SOLR
0 refills | Status: DC
Start: 2019-04-16 — End: 2019-04-16
  Administered 2019-04-16: 18:00:00 2 g via INTRAVENOUS

## 2019-04-16 MED ORDER — LACTATED RINGERS IV SOLP
1000 mL | INTRAVENOUS | 0 refills | Status: DC
Start: 2019-04-16 — End: 2019-04-17
  Administered 2019-04-16: 18:00:00 1000.000 mL via INTRAVENOUS
  Administered 2019-04-16: 16:00:00 1000 mL via INTRAVENOUS

## 2019-04-16 MED ORDER — ERTAPENEM 1GM IVP
1 g | INTRAVENOUS | 0 refills | Status: DC
Start: 2019-04-16 — End: 2019-04-26
  Administered 2019-04-16 – 2019-04-25 (×10): 1 g via INTRAVENOUS

## 2019-04-16 MED ORDER — DEXTRAN 70-HYPROMELLOSE (PF) 0.1-0.3 % OP DPET
0 refills | Status: DC
Start: 2019-04-16 — End: 2019-04-16
  Administered 2019-04-16: 17:00:00 2 [drp] via OPHTHALMIC

## 2019-04-16 MED ORDER — THROMBIN (BOVINE) 5,000 UNIT TP SOLR
0 refills | Status: DC
Start: 2019-04-16 — End: 2019-04-16
  Administered 2019-04-16: 18:00:00 5000 [IU] via TOPICAL

## 2019-04-16 MED ORDER — DEXAMETHASONE SODIUM PHOSPHATE 4 MG/ML IJ SOLN
INTRAVENOUS | 0 refills | Status: DC
Start: 2019-04-16 — End: 2019-04-16
  Administered 2019-04-16: 18:00:00 4 mg via INTRAVENOUS

## 2019-04-16 MED ORDER — ONDANSETRON HCL (PF) 4 MG/2 ML IJ SOLN
INTRAVENOUS | 0 refills | Status: DC
Start: 2019-04-16 — End: 2019-04-16
  Administered 2019-04-16: 18:00:00 4 mg via INTRAVENOUS

## 2019-04-16 MED ORDER — PHENYLEPHRINE IN 0.9% NACL(PF) 1 MG/10 ML (100 MCG/ML) IV SYRG
INTRAVENOUS | 0 refills | Status: DC
Start: 2019-04-16 — End: 2019-04-16
  Administered 2019-04-16 (×2): 50 ug via INTRAVENOUS

## 2019-04-16 NOTE — Case Management (ED)
Case Management Progress Note    NAME:Eric Cabrera                          MRN: [id number]              DOB:10/17/1966          AGE: 53 y.o.  ADMISSION DATE: 04/11/2019             DAYS ADMITTED: LOS: 4 days      Today???s Date: 04/16/2019    Plan  DC planning ongoin    Interventions  ? Support   Support: Pt/Family Updates re:POC or DC Plan  ? Info or Referral   Information or Referral to Community Resources: No Needs Identified  ? Discharge Planning   Discharge Planning: Durable Medical Equipment and Supplies(Per pt's mother, pt is in need of a new mattress, new wheelchair cushion and new shower chair)     ??? Discussed pt in huddle  ??? Dr waiting on surgery recommendations after procedure r/t d/c disposition  ??? Called pt brother, Nell Schrack, [phone] to discuss home care and care capabilities further  ??? No answer, left a detailed message with contact # requesting a return call    ? Medication Needs   Medication Needs: No Needs Identified     ? Financial   Financial: No Needs Identified  ? Legal   Legal: No Needs Identified  ? Other   Other/None: No needs identified    Disposition  ? Expected Discharge Date    Expected Discharge Date: 04/20/19  Expected Discharge Time: 1300  ? Transportation   Does the patient need discharge transport arranged?: Yes(Family states HCBS CG's provide transporation)  ? Next Level of Care (Acute Psych discharges only)      ? Discharge Disposition          Durable Medical Equipment      No service has been selected for the patient.      [facility]      No service has been selected for the patient.      [facility]      No service has been selected for the patient.      [facility]      No service has been selected for the patient.      Neldon Mc, LMSW  Social Work Case Manager  Office: [phone]  Pager: [phone]

## 2019-04-16 NOTE — Progress Notes
Pharmacy Vancomycin Note  Subjective:   Eric Cabrera is a 53 y.o. male being treated for Subacute osteomyelitis right femur .    Objective:     Current Vancomycin Orders   Medication Dose Route Frequency   ??? vancomycin (VANCOCIN) 1,250 mg in sodium chloride 0.9% (NS) IVPB  1,250 mg Intravenous Q12H*   ??? vancomycin, pharmacy to manage  1 each Service Per Pharmacy     Start Date of  vancomycin therapy: 04/16/2019  White Blood Cells   Date/Time Value Ref Range Status   04/16/2019 0433 6.6 4.5 - 11.0 K/UL Final   04/15/2019 0422 6.8 4.5 - 11.0 K/UL Final   04/14/2019 0450 6.0 4.5 - 11.0 K/UL Final     Creatinine   Date/Time Value Ref Range Status   04/16/2019 0433 0.29 (L) 0.4 - 1.24 MG/DL Final   95/62/1308 6578 0.31 (L) 0.4 - 1.24 MG/DL Final   46/96/2952 8413 0.29 (L) 0.4 - 1.24 MG/DL Final     Blood Urea Nitrogen   Date/Time Value Ref Range Status   04/16/2019 0433 13 7 - 25 MG/DL Final     Estimated CrCl: 130    Intake/Output Summary (Last 24 hours) at 04/16/2019 1525  Last data filed at 04/16/2019 1500  Gross per 24 hour   Intake 3282 ml   Output 2875 ml   Net 407 ml      Actual Weight:  89.3 kg (196 lb 12.8 oz)  Dosing BW:  89.3 kg       Assessment:   Target levels for this patient:  1.  AUC (mcg*h/mL):  400-600    Plan:   1. start vanc 1250 mg iv q12hrs, pt has tolerated this dose in the past with therapeutic levels  2. Next scheduled level(s): will get AUC levels around the 4th dose  3. Pharmacy will continue to monitor and adjust therapy as needed.    Lonna Cobb, [facility]  04/16/2019

## 2019-04-17 ENCOUNTER — Encounter: Admit: 2019-04-17 | Discharge: 2019-04-17 | Payer: MEDICARE

## 2019-04-17 LAB — GRAM STAIN

## 2019-04-17 LAB — CULTURE-BLOOD W/SENSITIVITY

## 2019-04-17 LAB — CBC: Lab: 7.1 K/UL — ABNORMAL LOW (ref >60)

## 2019-04-17 LAB — BASIC METABOLIC PANEL: Lab: 138 MMOL/L — ABNORMAL LOW (ref 137–147)

## 2019-04-17 NOTE — Care Plan
Healing of wound (wounds and Incisions):   Provide wound/incision care as ordered   Assess for signs and symptoms of wound infection   Assess wound site healing   Implement wound/incision care as ordered  Goal: Healing of skin (Pressure Injury)  Outcome: Goal Ongoing  Flowsheets (Taken 04/16/2019 2333)  Healing of skin (Pressure Injury):   Assess for signs and symptoms of pressure injury infection   Assess pressure injury   Provide pressure injury care education as ordered   Implement pressure injury care as ordered   Use the National Pressure Injury Advisory Panel Staging System for grading pressure injuries   Implement pressure relieving device/specialty mattress   Promote ambulation     Problem: Discharge Planning  Goal: Participation in plan of care  Outcome: Goal Ongoing  Flowsheets (Taken 04/16/2019 2333)  Participation in Plan of Care: Involve patient/caregiver in care planning decision making  Goal: Knowledge regarding plan of care  Outcome: Goal Ongoing  Flowsheets (Taken 04/16/2019 2333)  Knowledge regarding plan of care:   Provide admission education to parent/caregiver   Provide fall prevention education   Provide VTE signs and symptoms education   Provide infection prevention education   Provide plan of care education   Provide medication management education   Provide procedural and treatment education  Goal: Prepared for discharge  Outcome: Goal Ongoing  Flowsheets (Taken 04/16/2019 2333)  Prepared for discharge:   Collaborate with multidisciplinary team for hospital discharge coordination   Complete ADL ability assessment   Provide safe use medical equipment education   Provide diet and oral health education     Problem: Mobility/Activity Intolerance  Goal: Maximize functional ADL's and mobility outcomes  Outcome: Goal Ongoing  Flowsheets (Taken 04/16/2019 2333)  Maximize functional ADLs and mobility outcomes:   Maintain body position   Manage environmental safety     Problem: Nutrition Deficit

## 2019-04-17 NOTE — Case Management (ED)
Request for Case Management Resources    Delivered LTACH list to pt per the request of Neldon Mc, [facility]. Pt requested that CMA add contact information for [facility] at the bottom of the [facility] list as well as place list in his closet with his personal belongings.      Scarlette Ar  Case Management Assistant
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
## 2019-04-18 ENCOUNTER — Inpatient Hospital Stay: Admit: 2019-04-18 | Discharge: 2019-04-18 | Payer: MEDICARE

## 2019-04-18 DIAGNOSIS — L89150 Pressure ulcer of sacral region, unstageable: Secondary | ICD-10-CM

## 2019-04-18 LAB — VANCOMYCIN TROUGH
Lab: 5.1 ug/mL — ABNORMAL LOW (ref 10.0–20.0)
Lab: 11 ug/mL (ref 10.0–20.0)

## 2019-04-18 LAB — VANCOMYCIN 2HR POST DOSE: Lab: 18 ug/mL (ref 7–25)

## 2019-04-18 MED ORDER — VANCOMYCIN 1,500 MG IVPB
1500 mg | Freq: Two times a day (BID) | INTRAVENOUS | 0 refills | Status: DC
Start: 2019-04-18 — End: 2019-04-23
  Administered 2019-04-19 – 2019-04-23 (×18): 1500 mg via INTRAVENOUS
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
## 2019-04-18 NOTE — Operative Report(Direct Entry)
OPERATIVE REPORT    Name: Eric Cabrera is a 53 y.o. 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  A timeout procedure was carried out.  Patient received general anesthesia with an endotracheal tube.  Patient was placed in left lateral position.  All the pressure points were well protected.  Patient was safely secured on the operating table using bean bag and safety belt. Previously applied dressings were removed and patient's right posterior thigh, buttock, hip and perineum were sterile prepped and draped. We started with excisional debridement of right trochanteric pressure ulcer. It had small surface opening with wide area of subcutaneous and muscle necrosis. Skin incision was made with #10 scalpel blade in horizontal fashion across the width of wound. Cavity was entered. All devitalized tissues including skin, subcutaneous tissue and muscle were sharply excised till healthy bleeding was observed from wound margins. We used # 10 scalpel and Curette for this. bone though rough was covered with fascial layer. Trochanteric wound was packed with lap sponge. Next we worked on right ischial ulcer. Pressure wound was excised using #10 scalpel blade through skin, subcutaneous tissue, deep fascia and muscle. It was sent for surgical pathology examination. Wound base overlying the ischial tuberosity  bone was separated using periosteal elevator. Bone, suspected of infection, was resected using curved osteotome till healthy, well vascularized bone was observed. Both wounds were thoroughly irrigated using pulsavac and sterile saline solution. Hemostasis was achieved using topical thrombin solution, telfa and compression. At this point, we dressed wounds with half strength Dakin's solution soaked Kerlix, ABD pad, and Hypafix tape. After culture results are finalized, these wounds will required coverage with flap. At this point surgery was completed and patient was returned to supine position. Patient was allowed to wake up and extubated when ready. Patient was sent to PACU for recovery.       Estimated Blood Loss:  100 ml    Specimen(s) Removed/Disposition:   ID Type Source Tests Collected by Time Destination   A : RIGHT TROCHANTERIC PRESSURE ULCER WOUND Tissue Incision CULTURE-ANAEROBIC, CULTURE-WOUND/TISSUE/FLUID(AEROBIC ONLY)W/SENSITIVITY, CULTURE-TB (AFB), GRAM STAIN, CULTURE-FUNGAL,OTHER Yekaterina Escutia, MD 04/16/2019 1239    C : RIGHT ISCHIAL PRESSURE ULCER WOUND Tissue Incision CULTURE-ANAEROBIC, CULTURE-WOUND/TISSUE/FLUID(AEROBIC ONLY)W/SENSITIVITY, CULTURE-TB (AFB), GRAM STAIN, CULTURE-FUNGAL,OTHER Irineo Axon, MD 04/16/2019 1250    D : RIGHT ISCHIAL TUBEROSITY BONE Bone Incision CULTURE-ANAEROBIC, CULTURE-WOUND/TISSUE/FLUID(AEROBIC ONLY)W/SENSITIVITY, CULTURE-TB (AFB), CULTURE-FUNGAL,OTHER Irineo Axon, MD 04/16/2019 1252  Attestation: I personally performed this procedure with Rafael Bihari, Physician Assistant/APP as the surgical assistant because there was no qualified resident available.  ??? Complications:  None    Implants: None    Drains: None    Disposition:  PACU - stable    Irineo Axon, MD  Pager

## 2019-04-18 NOTE — Care Plan
Assure position change   Monitor skin integrity   Provide incontinence management interventions   Reduce skin shear, friction and tissue load   Provide skin self-assessment education   Consider consult for skin integrity   Provide skin care interventions  Goal: Healing of skin (Wound & Incision)  Outcome: Goal Ongoing  Flowsheets (Taken 04/16/2019 2333)  Healing of wound (wounds and Incisions):   Provide wound/incision care as ordered   Assess for signs and symptoms of wound infection   Assess wound site healing   Implement wound/incision care as ordered  Goal: Healing of skin (Pressure Injury)  Outcome: Goal Ongoing  Flowsheets (Taken 04/16/2019 2333)  Healing of skin (Pressure Injury):   Assess for signs and symptoms of pressure injury infection   Assess pressure injury   Provide pressure injury care education as ordered   Implement pressure injury care as ordered   Use the National Pressure Injury Advisory Panel Staging System for grading pressure injuries   Implement pressure relieving device/specialty mattress   Promote ambulation     Problem: Discharge Planning  Goal: Participation in plan of care  Outcome: Goal Ongoing  Flowsheets (Taken 04/16/2019 2333)  Participation in Plan of Care: Involve patient/caregiver in care planning decision making  Goal: Knowledge regarding plan of care  Outcome: Goal Ongoing  Flowsheets (Taken 04/16/2019 2333)  Knowledge regarding plan of care:   Provide admission education to parent/caregiver   Provide fall prevention education   Provide VTE signs and symptoms education   Provide infection prevention education   Provide plan of care education   Provide medication management education   Provide procedural and treatment education  Goal: Prepared for discharge  Outcome: Goal Ongoing  Flowsheets (Taken 04/16/2019 2333)  Prepared for discharge:   Collaborate with multidisciplinary team for hospital discharge coordination   Complete ADL ability assessment Provide safe use medical equipment education   Provide diet and oral health education     Problem: Mobility/Activity Intolerance  Goal: Maximize functional ADL's and mobility outcomes  Outcome: Goal Ongoing  Flowsheets (Taken 04/16/2019 2333)  Maximize functional ADLs and mobility outcomes:   Maintain body position   Manage environmental safety     Problem: Nutrition Deficit  Goal: Adequate nutritional intake  Outcome: Goal Ongoing  Flowsheets (Taken 04/16/2019 2333)  Adequate nutritional intake:   Assess dietary preferences   Promote oral fluid intake   Knowledge of nutritional diet   Assess nutritional status     Problem: Harm to self, low risk of suicide  Goal: Absence of harm to self, low risk of suicide  Outcome: Goal Ongoing  Flowsheets (Taken 04/16/2019 2333)  Absence of harm to self: Assess and report significant changes in mood and affect     Problem: Self-Care Deficit  Goal: Maximize ADL functioning  Outcome: Goal Ongoing

## 2019-04-18 NOTE — Anesthesia Pain Rounding
Anesthesia Follow-Up Evaluation: Post-Procedure Day One    Name: Eric Cabrera     MRN: [id number]     DOB: 1966/08/01     Age: 53 y.o.     Sex: male   __________________________________________________________________________     Procedure Date: 04/16/2019   Procedure: Procedure(s) with comments:  EXCISION ISCHIAL PRESSURE ULCER WITH OSTECTOMY - CASE LENGTH 2.5 HOURS    Physical Assessment  Height: 185.4 cm (73)  Weight: 80.1 kg (176 lb 9.6 oz)    Vital Signs (Last Filed in 24 hours)  BP: 96/60 (05/14 1601)  Temp: 36.8 ???C (98.3 ???F) (05/14 1601)  Pulse: 64 (05/14 1601)  Respirations: 16 PER MINUTE (05/14 1601)  SpO2: 94 % (05/14 1601)    Patient History   Allergies  Allergies   Allergen Reactions   ??? Sulfa (Sulfonamide Antibiotics) REDNESS        Medications  Scheduled Meds:ascorbic acid (VITAMIN C) tablet 500 mg, 500 mg, Oral, QDAY  baclofen (LIORESAL) tablet 40 mg, 40 mg, Oral, TID  diazePAM (VALIUM) tablet 5 mg, 5 mg, Oral, BID  enoxaparin (LOVENOX) syringe 40 mg, 40 mg, Subcutaneous, QDAY(21)  ertapenem (INVanz) IVP 1 g, 1 g, Intravenous, Q24H*  levETIRAcetam (KEPPRA) tablet 750 mg, 750 mg, Oral, BID  sodium hypochlorite (DAKIN'S 1/4 STRENGTH) 0.125 % topical solution, , Irrigation, BID  vancomycin (VANCOCIN) 1,250 mg in sodium chloride 0.9% (NS) IVPB, 1,250 mg, Intravenous, Q12H*  vitamin A & D topical ointment, , Topical, BID  zinc sulfate capsule 220 mg, 220 mg, Oral, QDAY    Continuous Infusions:  PRN and Respiratory Meds:bisacodyL QDAY PRN, loperamide PRN, vancomycin, pharmacy to manage Per Pharmacy      Diagnostic Tests  Hematology:   Lab Results   Component Value Date    HGB 12.6 04/17/2019    HCT 38.7 04/17/2019    PLTCT 259 04/17/2019    WBC 7.1 04/17/2019    NEUT 55 04/16/2019    ANC 3.62 04/16/2019    ALC 2.05 04/16/2019    MONA 7 04/16/2019    AMC 0.49 04/16/2019    EOSA 6 04/16/2019    ABC 0.03 04/16/2019    MCV 88.9 04/17/2019    MCH 29.0 04/17/2019    MCHC 32.7 04/17/2019

## 2019-04-19 NOTE — Care Plan
Outcome: Goal Ongoing  Flowsheets (Taken 04/19/2019 0221)  Healing of skin (Pressure Injury):   Assess for signs and symptoms of pressure injury infection   Assess pressure injury   Implement pressure injury care as ordered   Implement pressure relieving device/specialty mattress   Provide pressure injury care education as ordered     Problem: Discharge Planning  Goal: Participation in plan of care  Outcome: Goal Ongoing  Flowsheets (Taken 04/19/2019 0221)  Participation in Plan of Care: Involve patient/caregiver in care planning decision making  Goal: Knowledge regarding plan of care  Outcome: Goal Ongoing  Flowsheets (Taken 04/19/2019 0221)  Knowledge regarding plan of care:   Provide VTE signs and symptoms education   Provide fall prevention education   Provide plan of care education   Provide infection prevention education   Provide procedural and treatment education   Provide medication management education  Goal: Prepared for discharge  Outcome: Goal Ongoing  Flowsheets (Taken 04/19/2019 0221)  Prepared for discharge:   Provide safe use medical equipment education   Provide diet and oral health education   Collaborate with multidisciplinary team for hospital discharge coordination   Complete ADL ability assessment     Problem: Mobility/Activity Intolerance  Goal: Maximize functional ADL's and mobility outcomes  Outcome: Goal Ongoing  Flowsheets (Taken 04/19/2019 0221)  Maximize functional ADLs and mobility outcomes:   Administer oxygen to maintain SpO2 at approprate levels   Manage environmental safety   Manage energy conservation for mobility/activity intolerance   Maintain body position   Consider consult for mobility/activity intolerance     Problem: Nutrition Deficit  Goal: Adequate nutritional intake  Outcome: Goal Ongoing  Flowsheets (Taken 04/19/2019 0221)  Adequate nutritional intake:   Assess dietary preferences   Promote oral fluid intake   Knowledge of nutritional diet   Consider consult for dietician
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
## 2019-04-20 LAB — CULTURE-WOUND/TISSUE/FLUID(AEROBIC ONLY)W/SENSITIVITY

## 2019-04-20 LAB — VANCOMYCIN TIMED LEVEL: Lab: 16 ug/mL — ABNORMAL HIGH (ref 7–25)

## 2019-04-20 LAB — AEROBIC BACTERIA, SUSCEPT
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
## 2019-04-20 NOTE — Care Plan
Problem: Infection, Risk of  Goal: Absence of infection  Outcome: Goal Ongoing  Flowsheets (Taken 04/20/2019 0331)  Absence of infection:   Assess for infection (Monitor SIRS Criteria)   Monitor for signs and symptoms of infection   Administer pharmacological therapies as ordered   Implement prevention measures as indicated  Goal: Knowledge of Infection Control Procedures  Outcome: Goal Ongoing  Flowsheets (Taken 04/20/2019 0331)  Knowledge of Infection Control procedures: Provide Isolation Precautions Education     Problem: Infection, Risk of, Central Venous Catheter-Associated Bloodstream Infection  Goal: Absence of CVC Associated Bloodstream infection  Outcome: Goal Ongoing  Flowsheets (Taken 04/20/2019 0331)  Absence of CVC associated bloodstream  infection:   Assess for central line catheter infection (Monitor SIRS criteria)   Manage central venous catheter   Provide education on central line home care   Follow central line bundle components   Preparation for central venous catheter insertion     Problem: Falls, High Risk of  Goal: Absence of falls-Adult Patient  Outcome: Goal Ongoing  Flowsheets (Taken 04/20/2019 0331)  Absence of falls-Adult Patient:   Complete Fall Risk Assessment.   Consider additional interventions if patient is confused, has gait/balance problems and on high risk medications.   Provide fall prevention strategies.   Provde safe environment.   Implement fall risk bundle.  Goal: Absence of Falls-Pediatric patient  Outcome: Goal Ongoing     Problem: Skin Integrity  Goal: Skin integrity intact  Outcome: Goal Ongoing  Flowsheets (Taken 04/20/2019 0331)  Skin integrity intact:   Assess nutrition   Promote nutrition   Assure position change   Monitor skin integrity   Reduce skin shear, friction and tissue load   Consider consult for skin integrity   Provide skin self-assessment education   Provide incontinence management interventions   Provide skin care interventions Maintain body position     Problem: Nutrition Deficit  Goal: Adequate nutritional intake  Outcome: Goal Ongoing  Flowsheets (Taken 04/20/2019 0331)  Adequate nutritional intake:   Assess dietary preferences   Promote oral fluid intake   Assess nutritional status     Problem: Harm to self, low risk of suicide  Goal: Absence of harm to self, low risk of suicide  Outcome: Goal Ongoing  Flowsheets (Taken 04/20/2019 0331)  Absence of harm to self:   Assess and report significant changes in mood and affect   Maintain a safe environment     Problem: Self-Care Deficit  Goal: Maximize ADL functioning  Outcome: Goal Ongoing

## 2019-04-21 ENCOUNTER — Encounter: Admit: 2019-04-21 | Discharge: 2019-04-21 | Payer: MEDICARE

## 2019-04-21 LAB — CULTURE-ANAEROBIC

## 2019-04-21 LAB — GRAM STAIN

## 2019-04-21 LAB — VANCOMYCIN 2HR POST DOSE: Lab: 22 ug/mL (ref 8.5–10.6)

## 2019-04-21 LAB — CULTURE-WOUND/TISSUE/FLUID(AEROBIC ONLY)W/SENSITIVITY

## 2019-04-21 LAB — CBC: Lab: 8.9 K/UL (ref 4.5–11.0)

## 2019-04-21 LAB — COMPREHENSIVE METABOLIC PANEL: Lab: 141 MMOL/L — ABNORMAL LOW (ref 137–147)

## 2019-04-21 MED ORDER — BACITRACIN ZINC 500 UNIT/GRAM TP OINT
0 refills | Status: DC
Start: 2019-04-21 — End: 2019-04-21
  Administered 2019-04-21: 16:00:00 1 via TOPICAL

## 2019-04-21 MED ORDER — OXYCODONE-ACETAMINOPHEN 5-325 MG PO TAB
1-2 | Freq: Once | ORAL | 0 refills | Status: DC | PRN
Start: 2019-04-21 — End: 2019-04-21

## 2019-04-21 MED ORDER — PROPOFOL INJ 10 MG/ML IV VIAL
0 refills | Status: DC
Start: 2019-04-21 — End: 2019-04-21
  Administered 2019-04-21: 16:00:00 30 mg via INTRAVENOUS
  Administered 2019-04-21: 13:00:00 150 mg via INTRAVENOUS

## 2019-04-21 MED ORDER — LIDOCAINE-EPINEPHRINE 1 %-1:100,000 IJ SOLN
0 refills | Status: DC
Start: 2019-04-21 — End: 2019-04-21
  Administered 2019-04-21: 14:00:00 20 mL via INTRAMUSCULAR

## 2019-04-21 MED ORDER — DEXTRAN 70-HYPROMELLOSE (PF) 0.1-0.3 % OP DPET
0 refills | Status: DC
Start: 2019-04-21 — End: 2019-04-21
  Administered 2019-04-21: 13:00:00 2 [drp] via OPHTHALMIC

## 2019-04-21 MED ORDER — FENTANYL CITRATE (PF) 50 MCG/ML IJ SOLN
0 refills | Status: DC
Start: 2019-04-21 — End: 2019-04-21
  Administered 2019-04-21: 13:00:00 100 ug via INTRAVENOUS

## 2019-04-21 MED ORDER — PHENYLEPHRINE IN 0.9% NACL(PF) 1 MG/10 ML (100 MCG/ML) IV SYRG
INTRAVENOUS | 0 refills | Status: DC
Start: 2019-04-21 — End: 2019-04-21
  Administered 2019-04-21: 13:00:00 200 ug via INTRAVENOUS
  Administered 2019-04-21: 14:00:00 100 ug via INTRAVENOUS
  Administered 2019-04-21: 16:00:00 50 ug via INTRAVENOUS

## 2019-04-21 MED ORDER — EPHEDRINE SULFATE 50 MG/5ML SYR (10 MG/ML) (AN)(OSM)
0 refills | Status: DC
Start: 2019-04-21 — End: 2019-04-21
  Administered 2019-04-21 (×2): 10 mg via INTRAVENOUS

## 2019-04-21 MED ORDER — FENTANYL CITRATE (PF) 50 MCG/ML IJ SOLN
12.5-25 ug | INTRAVENOUS | 0 refills | Status: DC | PRN
Start: 2019-04-21 — End: 2019-04-26

## 2019-04-21 MED ORDER — HYDROMORPHONE (PF) 2 MG/ML IJ SYRG
.5-1 mg | INTRAVENOUS | 0 refills | Status: DC | PRN
Start: 2019-04-21 — End: 2019-04-21

## 2019-04-21 MED ORDER — ONDANSETRON HCL (PF) 4 MG/2 ML IJ SOLN
INTRAVENOUS | 0 refills | Status: DC
Start: 2019-04-21 — End: 2019-04-21
  Administered 2019-04-21: 16:00:00 4 mg via INTRAVENOUS

## 2019-04-21 MED ORDER — ROCURONIUM 10 MG/ML IV SOLN
INTRAVENOUS | 0 refills | Status: DC
Start: 2019-04-21 — End: 2019-04-21
  Administered 2019-04-21: 13:00:00 50 mg via INTRAVENOUS

## 2019-04-21 MED ORDER — LACTATED RINGERS IV SOLP
0 refills | Status: DC
Start: 2019-04-21 — End: 2019-04-21

## 2019-04-21 MED ORDER — PROMETHAZINE 25 MG/ML IJ SOLN
6.25 mg | INTRAVENOUS | 0 refills | Status: DC | PRN
Start: 2019-04-21 — End: 2019-04-21

## 2019-04-21 MED ORDER — HYDROMORPHONE (PF) 2 MG/ML IJ SYRG
.5 mg | INTRAVENOUS | 0 refills | Status: DC | PRN
Start: 2019-04-21 — End: 2019-04-21

## 2019-04-21 MED ORDER — LIDOCAINE (PF) 200 MG/10 ML (2 %) IJ SYRG
0 refills | Status: DC
Start: 2019-04-21 — End: 2019-04-21
  Administered 2019-04-21: 13:00:00 80 mg via INTRAVENOUS

## 2019-04-21 MED ORDER — LACTATED RINGERS IV SOLP
INTRAVENOUS | 0 refills | Status: AC
Start: 2019-04-21 — End: ?
  Administered 2019-04-21 – 2019-04-23 (×4): 1000.000 mL via INTRAVENOUS

## 2019-04-21 MED ORDER — MEPERIDINE (PF) 25 MG/ML IJ SYRG
12.5 mg | INTRAVENOUS | 0 refills | Status: DC | PRN
Start: 2019-04-21 — End: 2019-04-21

## 2019-04-21 MED ORDER — GLYCOPYRROLATE 0.2 MG/ML IJ SOLN
0 refills | Status: DC
Start: 2019-04-21 — End: 2019-04-21
  Administered 2019-04-21: 13:00:00 .2 mg via INTRAVENOUS

## 2019-04-21 MED ADMIN — LACTATED RINGERS IV SOLP [4318]: INTRAVENOUS | @ 12:00:00 | Stop: 2019-04-21 | NDC 00338011704

## 2019-04-21 NOTE — Progress Notes
Plastic Surgery Progress Note    04/21/2019          S: Questions answered about OR case today.  Patient consented to proceed.    O:                   Vital Signs: Last Filed                Vital Signs: 24 Hour Range   BP: 113/75 (05/18 0358)  Temp: 36.3 ???C (97.4 ???F) (05/18 0358)  Pulse: 63 (05/18 0358)  Respirations: 16 PER MINUTE (05/18 0358)  SpO2: 97 % (05/18 0358)  BP: (87-113)/(50-76)   Temp:  [36.3 ???C (97.4 ???F)-36.9 ???C (98.5 ???F)]   Pulse:  [58-86]   Respirations:  [16 PER MINUTE-18 PER MINUTE]   SpO2:  [94 %-98 %]      Constitutional: Well-developed and well-nourished. No acute distress.  Head: Normocephalic and atraumatic.  Pulmonary/Chest: No respiratory distress, non-labored  Neurological: Alert and oriented to person, place and time.   Skin: Warm and dry. No rash noted. No erythema. No pallor.  Pressure ulcer dressings in place, clean and intact.          Active Problems:    Quadriplegic spinal paralysis (HCC)    Acute encephalopathy    Suprapubic catheter (HCC)    Seizure (HCC)    Decubitus ulcer of sacral region, unstageable [facility])        A/P:Eric Cabrera is a 53 y.o. Male with  Hx Quadriplegia, now with R ischial and trochanteric PU     - NPO for OR today  - Continue low air loss mattress   - Will update wound care recs post-op  - Prealbumin 24, continue nutritional optimization  - Vanc/erta per ID  - Continue care per primary team  - Will need placement for 6 weeks of bedrest with low air loss mattress in LTACH, anticipate okay to d/c at end of this week    Theola Sequin, MD  If questions, page please page plastic surgery gold team   [phone]

## 2019-04-21 NOTE — Progress Notes
Wound Team will sign off. Please re-consult if skin breakdown worsens.     Bettey Mare RN, BSN  Wound/ Ostomy Nursing Consult Service  Office: [phone]  Pager: [phone]  Wound/ Ostomy Team pager (after hours/ weekends): [phone]

## 2019-04-21 NOTE — Anesthesia Post-Procedure Evaluation
Post-Anesthesia Evaluation    Name: Eric Cabrera      MRN: [id number]     DOB: 09-14-1966     Age: 53 y.o.     Sex: male   __________________________________________________________________________     Procedure Date: 04/21/2019  Procedure(s) (LRB):  RECONSTRUCTION OF RIGHT ISCHIAL AND TROCHANTER PRESSURE ULCERS WITH FLAPS (Right)      Surgeon: Surgeon(s):  Irineo Axon, MD  Theola Sequin, MD    Post-Anesthesia Vitals  BP: 108/71 (05/18 1200)  Temp: 36.4 ???C (97.5 ???F) (05/18 1200)  Pulse: 66 (05/18 1200)  Respirations: 10 PER MINUTE (05/18 1200)  SpO2: 96 % (05/18 1200)  SpO2 Pulse: 66 (05/18 1200)   Vitals Value Taken Time   BP 108/71 04/21/2019 12:00 PM   Temp 36.4 ???C (97.5 ???F) 04/21/2019 12:00 PM   Pulse 66 04/21/2019 12:00 PM   Respirations 10 PER MINUTE 04/21/2019 12:00 PM   SpO2 96 % 04/21/2019 12:00 PM         Post Anesthesia Evaluation Note    Evaluation location: Pre/Post  Patient participation: recovered; patient participated in evaluation  Level of consciousness: alert    Pain score: 1  Pain management: adequate    Hydration: normovolemia  Temperature: 36.0???C - 38.4???C  Airway patency: adequate    Perioperative Events       Post-op nausea and vomiting: no PONV    Postoperative Status  Cardiovascular status: hemodynamically stable  Respiratory status: spontaneous ventilation  Follow-up needed: none        Perioperative Events  Perioperative Event: No  Emergency Case Activation: No

## 2019-04-21 NOTE — Progress Notes
2. Next scheduled level(s): Need to get a maintenance trough in a couple days to ensure not accumulating.   3. Pharmacy will continue to monitor and adjust therapy as needed.    Ethelene Hal, PharmD, BCPS  Internal Medicine Clinical Pharmacist    04/20/2019

## 2019-04-22 ENCOUNTER — Encounter: Admit: 2019-04-22 | Discharge: 2019-04-22 | Payer: MEDICARE

## 2019-04-22 ENCOUNTER — Inpatient Hospital Stay: Admit: 2019-04-22 | Discharge: 2019-04-22 | Payer: MEDICARE

## 2019-04-22 DIAGNOSIS — L89150 Pressure ulcer of sacral region, unstageable: Secondary | ICD-10-CM

## 2019-04-22 LAB — COMPREHENSIVE METABOLIC PANEL
Lab: 0.3 mg/dL — ABNORMAL LOW (ref 0.4–1.24)
Lab: 0.8 mg/dL — ABNORMAL LOW (ref >60)
Lab: 11 mg/dL (ref 7–25)
Lab: 13 U/L — ABNORMAL HIGH (ref 7–40)
Lab: 133 MMOL/L — ABNORMAL LOW (ref 137–147)
Lab: 14 U/L (ref 7–56)
Lab: 28 MMOL/L — ABNORMAL LOW (ref 21–30)
Lab: 3.1 g/dL — ABNORMAL LOW (ref 3.5–5.0)
Lab: 3.8 MMOL/L — ABNORMAL LOW (ref 3.5–5.1)
Lab: 56 U/L (ref 25–110)
Lab: 6 10*3/uL (ref 3–12)
Lab: 6.1 g/dL — ABNORMAL LOW (ref >60)
Lab: 8.4 mg/dL — ABNORMAL LOW (ref 8.5–10.6)
Lab: 98 mg/dL — ABNORMAL HIGH (ref 70–100)
Lab: 99 MMOL/L — ABNORMAL LOW (ref 98–110)
Lab: >60 mL/min (ref >60)
Lab: >60 mL/min (ref >60)

## 2019-04-22 LAB — LACTIC ACID(LACTATE): Lab: 0.8 MMOL/L (ref 0.5–2.0)

## 2019-04-22 LAB — CBC: Lab: 9.8 K/UL — ABNORMAL LOW (ref 4.5–11.0)

## 2019-04-22 LAB — CORTISOL,RANDOM: Lab: 11 ug/dL (ref 5.0–20.0)

## 2019-04-22 MED ORDER — SODIUM CHLORIDE 0.9 % IV SOLP
1000 mL | INTRAVENOUS | 0 refills | Status: CP
Start: 2019-04-22 — End: ?
  Administered 2019-04-22: 21:00:00 1000 mL via INTRAVENOUS

## 2019-04-22 MED ORDER — SODIUM CHLORIDE 0.9 % IV SOLP
1000 mL | INTRAVENOUS | 0 refills | Status: CP
Start: 2019-04-22 — End: ?
  Administered 2019-04-22: 14:00:00 1000 mL via INTRAVENOUS

## 2019-04-22 NOTE — Other
Immediate Post Procedure Note    Date:  04/22/2019                                         Attending Physician:   B. Ree Kida, MD  Performing Provider:  Riley Kill, MD    Consent:  Consent obtained from patient.  Time out performed: Consent obtained, correct patient verified, correct procedure verified, correct site verified, patient marked as necessary.     Pre/Post Procedure Diagnosis:  Pressure ulcer  Indications:  PICC Malposition    Anesthesia: Local 10 mL 2% lidocaine without epinephrine  Procedure(s):  PICC reposition  Findings:  Indwelling PICC repositioned with tip in proximal RA.      Estimated Blood Loss:  None/Negligible  Specimen(s) Removed/Disposition:  None  Complications: None  Patient Tolerated Procedure: Well  Post-Procedure Condition:  unchanged    Riley Kill, MD

## 2019-04-22 NOTE — Progress Notes
Plastic Surgery Progress Note    04/22/2019          S: Febrile overnight.    O:                   Vital Signs: Last Filed                Vital Signs: 24 Hour Range   BP: 81/48 (05/18 2315)  Temp: 38.9 ???C (102 ???F) (05/19 0215)  Pulse: 73 (05/18 2315)  Respirations: 16 PER MINUTE (05/18 2315)  SpO2: 93 % (05/18 2315)  BP: (81-117)/(48-77)   Temp:  [36.4 ???C (97.5 ???F)-38.9 ???C (102 ???F)]   Pulse:  [66-78]   Respirations:  [10 PER MINUTE-16 PER MINUTE]   SpO2:  [93 %-98 %]      Constitutional: Well-developed and well-nourished. No acute distress.  Head: Normocephalic and atraumatic.  Pulmonary/Chest: No respiratory distress, non-labored  Neurological: Alert and oriented to person, place and time.   Skin: Warm and dry. No rash noted. No erythema. No pallor.  Pressure ulcer dressings in place, clean and intact.          Active Problems:    Quadriplegic spinal paralysis (HCC)    Acute encephalopathy    Suprapubic catheter (HCC)    Seizure (HCC)    Decubitus ulcer of sacral region, unstageable [facility])        A/P:Eric Cabrera is a 53 y.o. Male with  Hx Quadriplegia, now with R ischial and trochanteric PU     - Agree with blood culture per primary team, no clear source of fevers  - Continue low air loss mattress   - Leave dressing in place to right lower extremity, plastics will change 5/20  - Continue nutritional optimization  - Vanc/erta per ID  - Continue care per primary team  - Will need placement for 6 weeks of bedrest with low air loss mattress in LTACH, should be okay to d/c 5/22, please ensure social work is arranging placement    Theola Sequin, MD  If questions, page please page plastic surgery gold team   [phone]

## 2019-04-22 NOTE — Progress Notes
Patient transported back to [facility] via bed by RN.

## 2019-04-22 NOTE — Consults
Pre Procedure History and Physical/Sedation Plan      Procedure Date:  04/22/2019    Planned Procedure(s): PICC exchange    Indication for exam: Malposition  ________________________________________________________________    Chief Complaint:   Malposition     Previous Anesthetic/Sedation History:  Reviewed    Allergies:  Sulfa (sulfonamide antibiotics)  Medications:  Scheduled Meds:[MAR Hold] ascorbic acid (VITAMIN C) tablet 500 mg, 500 mg, Oral, QDAY  [MAR Hold] baclofen (LIORESAL) tablet 40 mg, 40 mg, Oral, TID  [MAR Hold] diazePAM (VALIUM) tablet 5 mg, 5 mg, Oral, BID  [MAR Hold] enoxaparin (LOVENOX) syringe 40 mg, 40 mg, Subcutaneous, QDAY(21)  ertapenem (INVanz) IVP 1 g, 1 g, Intravenous, Q24H*  levETIRAcetam (KEPPRA) tablet 750 mg, 750 mg, Oral, BID  [MAR Hold] sodium chloride 0.9 %   infusion, 1,000 mL, Intravenous, Bolus  [MAR Hold] sodium hypochlorite (DAKIN'S 1/4 STRENGTH) 0.125 % topical solution, , Irrigation, BID  vancomycin (VANCOCIN) 1,500 mg in sodium chloride 0.9% (NS) IVPB, 1,500 mg, Intravenous, Q12H*  [MAR Hold] vitamin A & D topical ointment, , Topical, BID  [MAR Hold] zinc sulfate capsule 220 mg, 220 mg, Oral, QDAY    Continuous Infusions:  ??? lactated ringers infusion 20 mL/hr at 04/21/19 1357     PRN and Respiratory Meds:[MAR Hold] bisacodyL QDAY PRN, [MAR Hold] fentaNYL citrate PF Q2H PRN, [MAR Hold] loperamide PRN, vancomycin, pharmacy to manage Per Pharmacy       Vital Signs:  Last Filed Vital Signs: 24 Hour Range   BP: 85/49 (05/19 1104)  Temp: 36.6 ???C (97.9 ???F) (05/19 1104)  Pulse: 83 (05/19 1104)  Respirations: 16 PER MINUTE (05/19 1104)  SpO2: 93 % (05/19 1104) BP: (81-106)/(44-67)   Temp:  [36.4 ???C (97.6 ???F)-38.9 ???C (102 ???F)]   Pulse:  [70-84]   Respirations:  [14 PER MINUTE-19 PER MINUTE]   SpO2:  [93 %-98 %]      Sedation/Medication Plan: Lidocaine  Personal history of sedation complications: Denies adverse event.   Family history of sedation complications: Denies adverse event. Medications for Reversal: None  Discussion/Reviews:  Physician has discussed risks and alternatives of this type of sedation and above planned procedures with patient    NPO Status: Acceptable  Anesthesia Classification:  ASA III (A patient with a severe systemic disease that limits activity, but is not incapacitating)  Pregnancy Status: N/A    Lab/Radiology/Other Diagnostic Tests  Labs:  Relevant labs reviewed    I have examined the patient, and there are no significant changes in their condition, from the previous H&P performed on 04/21/19.    Lorelee Market, APRN-NP  Pager [phone]

## 2019-04-22 NOTE — Anesthesia Pain Rounding
AMC 0.49 04/16/2019    EOSA 6 04/16/2019    ABC 0.03 04/16/2019    MCV 88.3 04/21/2019    MCH 30.0 04/21/2019    MCHC 34.0 04/21/2019    MPV 7.9 04/21/2019    RDW 15.8 04/21/2019         General Chemistry:   Lab Results   Component Value Date    NA 141 04/21/2019    K 3.8 04/21/2019    CL 105 04/21/2019    CO2 28 04/21/2019    GAP 8 04/21/2019    BUN 13 04/21/2019    CR 0.20 04/21/2019    GLU 93 04/21/2019    GLU 67 08/17/2016    CA 9.0 04/21/2019    ALBUMIN 3.3 04/21/2019    LACTIC 0.8 08/14/2014    OBSCA 1.12 08/19/2014    MG 2.0 03/23/2019    TOTBILI 0.3 04/21/2019    PO4 3.3 03/23/2019      Coagulation:   Lab Results   Component Value Date    PTT 31.0 09/27/2016    INR 1.7 03/26/2019         Follow-Up Assessment  Patient location during evaluation: floor      Anesthetic Complications:   Anesthetic complications: The patient did not experience any anesthestic complications.      Pain:  Score: 0    Management:adequate     Level of Consciousness: awake and alert   Hydration:acceptable     Airway Patency: patent   Respiratory Status: acceptable, room air and spontaneous ventilation     Cardiovascular Status:acceptable, blood pressure returned to baseline and hemodynamically stable   Regional/Neuroaxial:
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
## 2019-04-23 LAB — VANCOMYCIN TROUGH: Lab: 16 ug/mL — ABNORMAL LOW (ref >60)

## 2019-04-23 LAB — COMPREHENSIVE METABOLIC PANEL: Lab: 140 MMOL/L — ABNORMAL LOW (ref 137–147)

## 2019-04-23 LAB — CBC: Lab: 8 K/UL — ABNORMAL LOW (ref 4.5–11.0)

## 2019-04-23 LAB — VANCOMYCIN 2HR POST DOSE: Lab: 28 ug/mL — ABNORMAL HIGH (ref 80–100)

## 2019-04-23 MED ORDER — VANCOMYCIN 1G/250ML D5W IVPB (VIAL2BAG)
1 g | Freq: Two times a day (BID) | INTRAVENOUS | 0 refills | Status: DC
Start: 2019-04-23 — End: 2019-04-26
  Administered 2019-04-23 – 2019-04-25 (×10): 1000 mg via INTRAVENOUS

## 2019-04-23 NOTE — Progress Notes
OCCUPATIONAL THERAPY  NOTE    Name: Eric Cabrera        MRN: [id number]          DOB: 11-Sep-1966          Age: 53 y.o.  Admission Date: 04/11/2019             LOS: 12 days    Completed skin assessment with bedside RN. Patient has areas of redness and breakdown from splint. Instructed patient and RN to leave splint off at this time. Recommend leaving splints off until areas are healed and reassess for appropriate splint at that time.    Therapist: Azucena Kuba, OTR/L  Date: 04/23/2019

## 2019-04-23 NOTE — Operative Report(Direct Entry)
including a short segment of tendinous portion. At this point we started proximal dissection towards vascular pedicle. Using doppler probe, artery was identified entering approximately 8 cm distal to pubic tubercle. Once adequate length of muscle flap was dissected, a subcutaneous tunnel was developed to deliver the muscle flap in to the defect. A #19 F JP drain was placed underneath the flap and flap donor site and secured with a 3-0 suture. Distal end of flap was inset in to the superior most aspect of the defect using 0 PDS sutures. Additional anchoring of the muscle flap was achieved using 0 PDS sutures around the periphery of the defect along the base. Rest of the defect was closed in layers superficial to the muscle flap. Deep fascia was closed with 0 PDS. Superficial fascia and demis were closed with 2-0 PDS. Skin was closed with 0 prolene sutures. Flap donor site was closed using 0 PDS for deep fascia, 2-0 PDS for deep dermis, and surgical staples for skin.    Next we focused on trochanteric wound. Using doppler probe, perforator was marked over superolateral aspect of posterior thigh anterolateral to the wound. Using pulsavac and sterile saline solution, wound was thoroughly irrigated. Residual devitalized tissues within the trochanteric pressure wound were excised using #10 scalpel blade through skin, subcutaneous tissue, deep fascia and muscle. Wound base overlying the trochanteric bone was separated using periosteal elevator.Bone, suspected of infection, was removed using sharp curette till healthy, well vascularized bone was observed. Removed bone was sent for culture. Hemostasis was achieved using topical thrombin solution, telfa and compression. Open surface of bone  was sealed with bone wax. At this point, we directed out attention to planning flap. A V-shaped incision was planned that started at posteromedial edge of the wound, continuing inferiorly for 1/2 of thigh length before turing back with gentle curve and traversing anterolaterally. Base of the flap was superolaterally including the previously marked perforator. It was planned in a way that TFL muscle and perforator will be maintained within flap. Skin incision was made with #10 scalpel blade and continued through subcutaneous tissue and deep fascia. Flap was dissected at the level of and including deep fascia right above the lateral thigh and hamstring muscles. Care was taken to maintain vascular connection coming from superolateral aspect of thigh. Once adequate length of flap was dissected, it was rotated in to the defect. A #19 F JP drain was placed underneath the flap. Superomedial corner of flap was de-epithelized and inset to upper medial corner. Inset was achieved using 0 PDS sutures. Flap was rotated and advanced superiorly. Donor site was closed using 0 PDS for deep fascia, 2-0 PDS for deep dermis, and surgical staples for skin. Superior flap border was closed with 0 prolene at skin level.     Bacitracin, xeroform, ABD pad, and Hypafix tape were used to dress the suture lines. At this point surgery was completed and patient was returned to supine position. Patient was allowed to wake up and extubated when ready. Patient was sent to PACU for recovery.          Estimated Blood Loss:  150 ml    Specimen(s) Removed/Disposition:   ID Type Source Tests Collected by Time Destination   A : RIGHT FEMUR BONE FOR CULTURE Tissue Femur CULTURE-ANAEROBIC, CULTURE-WOUND/TISSUE/FLUID(AEROBIC ONLY)W/SENSITIVITY, CULTURE-TB (AFB), Romie Minus STAIN, CULTURE-FUNGAL,OTHER Irineo Axon, MD 04/21/2019 [phone]        Attestation: I performed this procedure with a resident. and The key portion of this procedure was performed in  my presence.    Complications:  None    Implants: None    Drains: Jackson-Pratt Drain: #2 =   mL    Disposition:  Fast Track    Irineo Axon, MD  Pager
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
## 2019-04-24 LAB — CBC
Lab: 8.3 K/UL — ABNORMAL LOW (ref >60)
Lab: 12 g/dL — ABNORMAL LOW (ref 13.5–16.5)
Lab: 144 10*3/uL — ABNORMAL LOW (ref 150–400)
Lab: 15 % — ABNORMAL HIGH (ref 11–15)
Lab: 30 pg (ref 26–34)
Lab: 34 g/dL (ref 32.0–36.0)
Lab: 36 % — ABNORMAL LOW (ref 40–50)
Lab: 4.1 M/UL — ABNORMAL LOW (ref 4.4–5.5)
Lab: 6.9 10*3/uL (ref 4.5–11.0)
Lab: 7.8 FL (ref 7–11)
Lab: 88 FL (ref 80–100)

## 2019-04-24 LAB — CORTISOL-AM: Lab: 9.7 ug/dL (ref 6.7–22.6)

## 2019-04-24 LAB — COMPREHENSIVE METABOLIC PANEL: Lab: 141 MMOL/L — ABNORMAL LOW (ref 137–147)

## 2019-04-24 LAB — MAGNESIUM: Lab: 1.9 mg/dL (ref 1.6–2.6)

## 2019-04-24 LAB — CULTURE-URINE W/SENSITIVITY

## 2019-04-24 MED ORDER — MULTIVIT-IRON-FA-CALCIUM-MINS 9 MG IRON-400 MCG PO TAB
1 | Freq: Every day | ORAL | 0 refills | Status: DC
Start: 2019-04-24 — End: 2019-04-26
  Administered 2019-04-25: 14:00:00 1 via ORAL

## 2019-04-24 MED ORDER — FERROUS SULFATE 325 MG (65 MG IRON) PO TAB
325 mg | Freq: Every day | ORAL | 0 refills | Status: DC
Start: 2019-04-24 — End: 2019-04-26
  Administered 2019-04-24 – 2019-04-25 (×2): 325 mg via ORAL

## 2019-04-24 MED ORDER — POTASSIUM CHLORIDE 20 MEQ PO TBTQ
40 meq | Freq: Once | ORAL | 0 refills | Status: CP
Start: 2019-04-24 — End: ?
  Administered 2019-04-24: 14:00:00 40 meq via ORAL

## 2019-04-24 MED ORDER — POLYETHYLENE GLYCOL 3350 17 GRAM PO PWPK
2 | Freq: Every day | ORAL | 0 refills | Status: DC
Start: 2019-04-24 — End: 2019-04-26
  Administered 2019-04-24 – 2019-04-25 (×2): 34 g via ORAL

## 2019-04-24 NOTE — Care Plan
Problem: Infection, Risk of  Goal: Absence of infection  Outcome: Goal Ongoing  Goal: Knowledge of Infection Control Procedures  Outcome: Goal Ongoing     Problem: Infection, Risk of, Central Venous Catheter-Associated Bloodstream Infection  Goal: Absence of CVC Associated Bloodstream infection  Outcome: Goal Ongoing     Problem: Falls, High Risk of  Goal: Absence of falls-Adult Patient  Outcome: Goal Ongoing  Goal: Absence of Falls-Pediatric patient  Outcome: Goal Ongoing     Problem: Skin Integrity  Goal: Skin integrity intact  Outcome: Goal Ongoing  Goal: Healing of skin (Wound & Incision)  Outcome: Goal Ongoing  Goal: Healing of skin (Pressure Injury)  Outcome: Goal Ongoing     Problem: Discharge Planning  Goal: Participation in plan of care  Outcome: Goal Ongoing  Goal: Knowledge regarding plan of care  Outcome: Goal Ongoing  Goal: Prepared for discharge  Outcome: Goal Ongoing     Problem: Mobility/Activity Intolerance  Goal: Maximize functional ADL's and mobility outcomes  Outcome: Goal Ongoing     Problem: Nutrition Deficit  Goal: Adequate nutritional intake  Outcome: Goal Ongoing     Problem: Harm to self, low risk of suicide  Goal: Absence of harm to self, low risk of suicide  Outcome: Goal Ongoing     Problem: Self-Care Deficit  Goal: Maximize ADL functioning  Outcome: Goal Ongoing

## 2019-04-24 NOTE — Progress Notes
RT Adult Assessment Note    NAME:Eric Cabrera             MRN: [id number]             DOB:04-10-1966          AGE: 53 y.o.  ADMISSION DATE: 04/11/2019             DAYS ADMITTED: LOS: 13 days    RT Treatment Plan:       Protocol Plan: Procedures  Cough Assist: PRN  PAP: Place a nursing order for IS Q1h While Awake for any of Lung Expansion indicators  Oxygen/Humidity: Discontinued  Monitoring: Discontinued    Additional Comments:  Impressions of the patient: alert  Intervention(s)/outcome(s): Quad cough not needed at this time   Patient education that was completed: IS with nursing  Recommendations to the care team: none    Vital Signs:  Pulse: 77  RR: 16 PER MINUTE  SpO2: 97 %  O2%: 21 %  Breath Sounds:  clear  Respiratory Effort:  non-labored

## 2019-04-25 LAB — CBC: Lab: 8.3 K/UL — ABNORMAL LOW (ref >60)

## 2019-04-25 LAB — 25-OH VITAMIN D (D2 + D3): Lab: 21 ng/mL — ABNORMAL LOW (ref >60)

## 2019-04-25 LAB — COMPREHENSIVE METABOLIC PANEL: Lab: 140 MMOL/L — ABNORMAL LOW (ref >60)

## 2019-04-25 MED ORDER — BACLOFEN 20 MG PO TAB
40 mg | Freq: Three times a day (TID) | ORAL | 0 refills | Status: DC
Start: 2019-04-25 — End: 2019-08-15

## 2019-04-25 MED ORDER — MULTIVIT-IRON-FA-CALCIUM-MINS 9 MG IRON-400 MCG PO TAB
1 | ORAL_TABLET | Freq: Every day | ORAL | 0 refills | 33.00000 days | Status: AC
Start: 2019-04-25 — End: ?

## 2019-04-25 MED ORDER — ERTAPENEM 1GM IVP
1 g | INTRAVENOUS | 0 refills | 5.00000 days | Status: AC
Start: 2019-04-25 — End: ?

## 2019-04-25 MED ORDER — VANCOMYCIN 1G/250ML D5W IVPB (VIAL2BAG)
1 g | Freq: Two times a day (BID) | INTRAVENOUS | 0 refills | 10.00000 days | Status: AC
Start: 2019-04-25 — End: ?

## 2019-04-25 MED ORDER — FERROUS SULFATE 325 MG (65 MG IRON) PO TAB
325 mg | ORAL_TABLET | Freq: Every day | ORAL | 0 refills | Status: AC
Start: 2019-04-25 — End: ?

## 2019-04-25 MED ORDER — POLYETHYLENE GLYCOL 3350 17 GRAM PO PWPK
34 g | Freq: Every day | ORAL | 0 refills | 22.00000 days | Status: AC
Start: 2019-04-25 — End: ?

## 2019-04-26 ENCOUNTER — Inpatient Hospital Stay: Admit: 2019-04-21 | Discharge: 2019-04-21 | Payer: MEDICARE

## 2019-04-26 ENCOUNTER — Inpatient Hospital Stay: Admit: 2019-04-16 | Discharge: 2019-04-16 | Payer: MEDICARE

## 2019-04-26 ENCOUNTER — Inpatient Hospital Stay: Admit: 2019-04-22 | Discharge: 2019-04-22 | Payer: MEDICARE

## 2019-04-26 ENCOUNTER — Inpatient Hospital Stay
Admit: 2019-04-11 | Discharge: 2019-04-26 | Disposition: A | Discharge status: Other Acute Inpatient Hospital | Payer: MEDICARE | Source: Other Acute Inpatient Hospital

## 2019-04-26 DIAGNOSIS — L89314 Pressure ulcer of right buttock, stage 4: ICD-10-CM

## 2019-04-26 DIAGNOSIS — D649 Anemia, unspecified: ICD-10-CM

## 2019-04-26 DIAGNOSIS — G40909 Epilepsy, unspecified, not intractable, without status epilepticus: ICD-10-CM

## 2019-04-26 DIAGNOSIS — M86651 Other chronic osteomyelitis, right thigh: ICD-10-CM

## 2019-04-26 DIAGNOSIS — Z66 Do not resuscitate: ICD-10-CM

## 2019-04-26 DIAGNOSIS — K5900 Constipation, unspecified: ICD-10-CM

## 2019-04-26 DIAGNOSIS — G9340 Encephalopathy, unspecified: ICD-10-CM

## 2019-04-26 DIAGNOSIS — L89610 Pressure ulcer of right heel, unstageable: ICD-10-CM

## 2019-04-26 DIAGNOSIS — G8250 Quadriplegia, unspecified: ICD-10-CM

## 2019-04-26 DIAGNOSIS — F17200 Nicotine dependence, unspecified, uncomplicated: ICD-10-CM

## 2019-04-26 DIAGNOSIS — Z9359 Other cystostomy status: ICD-10-CM

## 2019-04-26 DIAGNOSIS — N319 Neuromuscular dysfunction of bladder, unspecified: ICD-10-CM

## 2019-04-26 DIAGNOSIS — T82524A Displacement of infusion catheter, initial encounter: ICD-10-CM

## 2019-04-26 DIAGNOSIS — R509 Fever, unspecified: ICD-10-CM

## 2019-04-26 DIAGNOSIS — L89310 Pressure ulcer of right buttock, unstageable: ICD-10-CM

## 2019-04-26 DIAGNOSIS — L89313 Pressure ulcer of right buttock, stage 3: Principal | ICD-10-CM

## 2019-04-26 DIAGNOSIS — L89892 Pressure ulcer of other site, stage 2: ICD-10-CM

## 2019-04-26 DIAGNOSIS — L89150 Pressure ulcer of sacral region, unstageable: Secondary | ICD-10-CM

## 2019-04-26 DIAGNOSIS — I959 Hypotension, unspecified: ICD-10-CM

## 2019-04-26 DIAGNOSIS — R197 Diarrhea, unspecified: ICD-10-CM

## 2019-04-26 LAB — CULTURE-WOUND/TISSUE/FLUID(AEROBIC ONLY)W/SENSITIVITY

## 2019-04-26 LAB — CULTURE-ANAEROBIC

## 2019-04-26 NOTE — Case Management (ED)
?   Discharge Disposition                                          Durable Medical Equipment      No service has been selected for the patient.      [place] Destination - Selection Complete      Service Provider Request Status Selected Services Address Phone Number Fax Number    [facility] Selected Long Term Acute Care [address] [place], [place] [address] [phone]      [facility]      No service has been selected for the patient.      [facility]      No service has been selected for the patient.        Teodora Medici, LMSW  Phone: [phone]  Pager: [phone]

## 2019-04-28 LAB — CULTURE-BLOOD W/SENSITIVITY

## 2019-04-29 ENCOUNTER — Encounter: Admit: 2019-04-29 | Discharge: 2019-04-29 | Payer: MEDICARE

## 2019-05-23 ENCOUNTER — Encounter: Admit: 2019-05-23 | Discharge: 2019-05-23

## 2019-05-23 NOTE — Telephone Encounter
Con Memos from [facility] called and said patient talks everyday about how much he dislikes the low air loss mattress. he wants to know if there is another option for a home mattress because he says he can't tolerate this mattress and it makes him sweat. He also is wondering about getting a wheelchair cushion molded to him. I will investigate these things today and see if there are other options.

## 2019-07-29 ENCOUNTER — Encounter: Admit: 2019-07-29 | Discharge: 2019-07-29

## 2019-07-29 NOTE — Progress Notes
Initiated Phone call to patient for scheduled Telehealth visit with Dr. Robie Ridge @ 3:20p.m. Tuesday  07/29/2019. Atemptted to reach patient three individual times by phone number [phone]. Patient did not answer phone. Voicemail was left to call back.

## 2019-08-15 ENCOUNTER — Encounter: Admit: 2019-08-15 | Discharge: 2019-08-15

## 2019-08-15 MED ORDER — BACLOFEN 20 MG PO TAB
ORAL_TABLET | Freq: Three times a day (TID) | 0 refills | Status: DC
Start: 2019-08-15 — End: 2019-11-05

## 2019-11-01 ENCOUNTER — Encounter: Admit: 2019-11-01 | Discharge: 2019-11-01 | Payer: MEDICARE

## 2019-11-04 MED ORDER — LEVETIRACETAM 750 MG PO TAB
ORAL_TABLET | Freq: Two times a day (BID) | 0 refills
Start: 2019-11-04 — End: ?

## 2019-11-04 MED ORDER — BACLOFEN 20 MG PO TAB
ORAL_TABLET | Freq: Three times a day (TID) | 0 refills | Status: AC
Start: 2019-11-04 — End: ?

## 2019-12-29 ENCOUNTER — Encounter: Admit: 2019-12-29 | Discharge: 2019-12-29 | Payer: MEDICARE

## 2019-12-29 NOTE — Telephone Encounter
Patient left voicemail stating telehealth appointment scheduled for 12/31/2019 was not going to work for him and needed to reschedule.  Attempted to contact patient to reschedule appointment but his phone is not accepting calls at this time.

## 2019-12-31 ENCOUNTER — Encounter: Admit: 2019-12-31 | Discharge: 2019-12-31 | Payer: MEDICARE

## 2020-06-29 ENCOUNTER — Encounter: Admit: 2020-06-29 | Discharge: 2020-06-29 | Payer: MEDICARE

## 2020-06-29 DIAGNOSIS — G8250 Quadriplegia, unspecified: Secondary | ICD-10-CM

## 2020-06-29 DIAGNOSIS — M792 Neuralgia and neuritis, unspecified: Secondary | ICD-10-CM

## 2020-06-29 DIAGNOSIS — N319 Neuromuscular dysfunction of bladder, unspecified: Secondary | ICD-10-CM

## 2020-06-29 DIAGNOSIS — L8990 Pressure ulcer of unspecified site, unspecified stage: Secondary | ICD-10-CM

## 2020-06-29 DIAGNOSIS — N390 Urinary tract infection, site not specified: Secondary | ICD-10-CM

## 2020-06-29 DIAGNOSIS — S14105A Unspecified injury at C5 level of cervical spinal cord, initial encounter: Secondary | ICD-10-CM

## 2020-06-29 DIAGNOSIS — Z978 Presence of other specified devices: Secondary | ICD-10-CM

## 2020-06-29 DIAGNOSIS — Z9359 Other cystostomy status: Secondary | ICD-10-CM

## 2020-06-29 DIAGNOSIS — R569 Unspecified convulsions: Secondary | ICD-10-CM

## 2020-06-29 DIAGNOSIS — Z720 Tobacco use: Secondary | ICD-10-CM

## 2020-08-26 ENCOUNTER — Encounter: Admit: 2020-08-26 | Discharge: 2020-08-26 | Payer: MEDICARE

## 2020-08-26 MED ORDER — BACLOFEN 20 MG PO TAB
ORAL_TABLET | Freq: Three times a day (TID) | 0 refills | Status: AC
Start: 2020-08-26 — End: ?

## 2020-12-09 ENCOUNTER — Encounter: Admit: 2020-12-09 | Discharge: 2020-12-09 | Payer: MEDICARE

## 2020-12-09 MED ORDER — BACLOFEN 20 MG PO TAB
ORAL_TABLET | Freq: Three times a day (TID) | 0 refills
Start: 2020-12-09 — End: ?

## 2020-12-30 ENCOUNTER — Encounter: Admit: 2020-12-30 | Discharge: 2020-12-30 | Payer: MEDICARE

## 2020-12-30 NOTE — Telephone Encounter
LVM that I called.

## 2021-06-28 ENCOUNTER — Encounter: Admit: 2021-06-28 | Discharge: 2021-06-28 | Payer: MEDICARE

## 2021-06-28 MED ORDER — BACLOFEN 20 MG PO TAB
ORAL_TABLET | Freq: Three times a day (TID) | 0 refills
Start: 2021-06-28 — End: ?

## 2021-07-19 ENCOUNTER — Ambulatory Visit: Admit: 2021-07-19 | Discharge: 2021-07-20 | Payer: MEDICARE

## 2021-07-19 ENCOUNTER — Encounter: Admit: 2021-07-19 | Discharge: 2021-07-19 | Payer: MEDICARE

## 2021-07-19 DIAGNOSIS — L8990 Pressure ulcer of unspecified site, unspecified stage: Secondary | ICD-10-CM

## 2021-07-19 DIAGNOSIS — R569 Unspecified convulsions: Secondary | ICD-10-CM

## 2021-07-19 DIAGNOSIS — G8250 Quadriplegia, unspecified: Secondary | ICD-10-CM

## 2021-07-19 DIAGNOSIS — N319 Neuromuscular dysfunction of bladder, unspecified: Secondary | ICD-10-CM

## 2021-07-19 DIAGNOSIS — S14105A Unspecified injury at C5 level of cervical spinal cord, initial encounter: Secondary | ICD-10-CM

## 2021-07-19 DIAGNOSIS — Z9359 Other cystostomy status: Secondary | ICD-10-CM

## 2021-07-19 DIAGNOSIS — M792 Neuralgia and neuritis, unspecified: Secondary | ICD-10-CM

## 2021-07-19 DIAGNOSIS — S14105D Unspecified injury at C5 level of cervical spinal cord, subsequent encounter: Secondary | ICD-10-CM

## 2021-07-19 DIAGNOSIS — N390 Urinary tract infection, site not specified: Secondary | ICD-10-CM

## 2021-07-19 DIAGNOSIS — Z720 Tobacco use: Secondary | ICD-10-CM

## 2021-07-19 DIAGNOSIS — Z978 Presence of other specified devices: Secondary | ICD-10-CM

## 2021-07-19 NOTE — Patient Instructions
Continue Keppra 750 mg bid. Seizures are stable  Continue with pressure release every 2 hours in bed and shifting every 15-20 minutes while in WC.
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
## 2021-09-19 ENCOUNTER — Encounter: Admit: 2021-09-19 | Discharge: 2021-09-19 | Payer: MEDICARE

## 2021-09-19 MED ORDER — BACLOFEN 20 MG PO TAB
ORAL_TABLET | Freq: Three times a day (TID) | 0 refills
Start: 2021-09-19 — End: ?

## 2022-01-31 ENCOUNTER — Encounter: Admit: 2022-01-31 | Discharge: 2022-01-31 | Payer: MEDICARE

## 2022-01-31 MED ORDER — BACLOFEN 20 MG PO TAB
ORAL_TABLET | 0 refills | Status: AC
Start: 2022-01-31 — End: ?

## 2022-02-27 ENCOUNTER — Encounter: Admit: 2022-02-27 | Discharge: 2022-02-27 | Payer: MEDICARE

## 2022-02-27 DIAGNOSIS — R569 Unspecified convulsions: Secondary | ICD-10-CM

## 2022-02-27 DIAGNOSIS — L8990 Pressure ulcer of unspecified site, unspecified stage: Secondary | ICD-10-CM

## 2022-02-27 DIAGNOSIS — G8250 Quadriplegia, unspecified: Principal | ICD-10-CM

## 2022-02-27 DIAGNOSIS — Z720 Tobacco use: Secondary | ICD-10-CM

## 2022-02-27 DIAGNOSIS — Z978 Presence of other specified devices: Secondary | ICD-10-CM

## 2022-02-27 DIAGNOSIS — N319 Neuromuscular dysfunction of bladder, unspecified: Secondary | ICD-10-CM

## 2022-02-27 DIAGNOSIS — N390 Urinary tract infection, site not specified: Secondary | ICD-10-CM

## 2022-02-27 DIAGNOSIS — M792 Neuralgia and neuritis, unspecified: Secondary | ICD-10-CM

## 2022-02-27 DIAGNOSIS — Z9359 Other cystostomy status: Secondary | ICD-10-CM

## 2022-02-27 DIAGNOSIS — S14105A Unspecified injury at C5 level of cervical spinal cord, initial encounter: Secondary | ICD-10-CM

## 2022-05-03 ENCOUNTER — Encounter: Admit: 2022-05-03 | Discharge: 2022-05-03 | Payer: MEDICARE

## 2022-08-31 ENCOUNTER — Encounter: Admit: 2022-08-31 | Discharge: 2022-08-31 | Payer: MEDICARE

## 2022-08-31 MED ORDER — DIAZEPAM 5 MG PO TAB
5 mg | ORAL_TABLET | Freq: Two times a day (BID) | ORAL | 0 refills
Start: 2022-08-31 — End: ?

## 2022-09-04 ENCOUNTER — Encounter: Admit: 2022-09-04 | Discharge: 2022-09-04 | Payer: MEDICARE

## 2022-10-02 ENCOUNTER — Encounter: Admit: 2022-10-02 | Discharge: 2022-10-02 | Payer: MEDICARE

## 2022-10-02 MED ORDER — DIAZEPAM 5 MG PO TAB
5 mg | ORAL_TABLET | Freq: Two times a day (BID) | ORAL | 0 refills
Start: 2022-10-02 — End: ?

## 2022-10-17 ENCOUNTER — Encounter: Admit: 2022-10-17 | Discharge: 2022-10-17 | Payer: MEDICARE
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
## 2022-10-23 ENCOUNTER — Encounter: Admit: 2022-10-23 | Discharge: 2022-10-23 | Payer: MEDICARE

## 2022-10-23 DIAGNOSIS — J9690 Respiratory failure, unspecified, unspecified whether with hypoxia or hypercapnia: Secondary | ICD-10-CM

## 2022-10-23 DIAGNOSIS — A419 Sepsis, unspecified organism: Secondary | ICD-10-CM

## 2022-10-23 DIAGNOSIS — G8250 Quadriplegia, unspecified: Secondary | ICD-10-CM

## 2022-11-03 DEATH — deceased

## 2022-12-22 ENCOUNTER — Encounter: Admit: 2022-12-22 | Discharge: 2022-12-22
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
